# Patient Record
Sex: Female | Born: 1980 | Race: White | Hispanic: No | Marital: Married | Smoking: Former smoker
Health system: Southern US, Community
[De-identification: ages and names within clinical notes are randomized; demographics above are authoritative.]

## PROBLEM LIST (undated history)

## (undated) DIAGNOSIS — N760 Acute vaginitis: Secondary | ICD-10-CM

## (undated) DIAGNOSIS — F329 Major depressive disorder, single episode, unspecified: Secondary | ICD-10-CM

## (undated) DIAGNOSIS — F32A Depression, unspecified: Secondary | ICD-10-CM

## (undated) DIAGNOSIS — K769 Liver disease, unspecified: Secondary | ICD-10-CM

## (undated) DIAGNOSIS — B9689 Other specified bacterial agents as the cause of diseases classified elsewhere: Secondary | ICD-10-CM

## (undated) HISTORY — DX: Other specified bacterial agents as the cause of diseases classified elsewhere: N76.0

## (undated) HISTORY — DX: Major depressive disorder, single episode, unspecified: F32.9

## (undated) HISTORY — DX: Liver disease, unspecified: K76.9

## (undated) HISTORY — PX: REDUCTION MAMMAPLASTY: SUR839

## (undated) HISTORY — DX: Other specified bacterial agents as the cause of diseases classified elsewhere: B96.89

## (undated) HISTORY — DX: Depression, unspecified: F32.A

---

## 2005-03-27 ENCOUNTER — Emergency Department: Payer: Self-pay | Admitting: Emergency Medicine

## 2006-03-16 ENCOUNTER — Other Ambulatory Visit: Payer: Self-pay

## 2006-03-16 ENCOUNTER — Emergency Department: Payer: Self-pay | Admitting: Emergency Medicine

## 2014-01-06 ENCOUNTER — Encounter (HOSPITAL_COMMUNITY): Payer: Self-pay | Admitting: Psychology

## 2014-01-07 ENCOUNTER — Other Ambulatory Visit (HOSPITAL_COMMUNITY): Payer: No Typology Code available for payment source | Attending: Psychiatry | Admitting: Psychology

## 2014-01-07 ENCOUNTER — Encounter (HOSPITAL_COMMUNITY): Payer: Self-pay

## 2014-01-07 DIAGNOSIS — F17210 Nicotine dependence, cigarettes, uncomplicated: Secondary | ICD-10-CM | POA: Diagnosis not present

## 2014-01-07 DIAGNOSIS — Z6372 Alcoholism and drug addiction in family: Secondary | ICD-10-CM

## 2014-01-07 DIAGNOSIS — F1020 Alcohol dependence, uncomplicated: Secondary | ICD-10-CM | POA: Diagnosis not present

## 2014-01-07 DIAGNOSIS — F4310 Post-traumatic stress disorder, unspecified: Secondary | ICD-10-CM

## 2014-01-07 DIAGNOSIS — F1311 Sedative, hypnotic or anxiolytic abuse, in remission: Secondary | ICD-10-CM | POA: Insufficient documentation

## 2014-01-07 DIAGNOSIS — Z599 Problem related to housing and economic circumstances, unspecified: Secondary | ICD-10-CM | POA: Insufficient documentation

## 2014-01-07 DIAGNOSIS — Z811 Family history of alcohol abuse and dependence: Secondary | ICD-10-CM

## 2014-01-07 DIAGNOSIS — F1220 Cannabis dependence, uncomplicated: Secondary | ICD-10-CM | POA: Insufficient documentation

## 2014-01-07 DIAGNOSIS — F1910 Other psychoactive substance abuse, uncomplicated: Secondary | ICD-10-CM
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
## 2014-01-10 ENCOUNTER — Encounter (HOSPITAL_COMMUNITY): Payer: Self-pay | Admitting: Psychology

## 2014-01-10 ENCOUNTER — Other Ambulatory Visit (HOSPITAL_COMMUNITY): Payer: No Typology Code available for payment source | Admitting: Psychology

## 2014-01-10 DIAGNOSIS — F1020 Alcohol dependence, uncomplicated: Secondary | ICD-10-CM | POA: Diagnosis not present

## 2014-01-11 ENCOUNTER — Encounter (HOSPITAL_COMMUNITY): Payer: Self-pay | Admitting: Psychology

## 2014-01-11 NOTE — Progress Notes (Signed)
    Daily Group Progress Note  Program: CD-IOP   Group Time: 1-2:30 pm  Participation Level: Active  Behavioral Response: Appropriate and Sharing  Type of Therapy: Process Group  Topic: After checking in, group members took turns sharing about their weekends and any struggles, challenges, or frustrations they faced.  Each group member also provided information about meetings attended.  One group member admitted that he relapsed over the weekend, and the group helped him process what happened and realize where he could have intervened.  The group then transitioned into psychoeducation exploring different dysfunctional roles that exist in families, and identified which role they feel they played in their families of origin.   Group Time: 2:45- 4pm  Participation Level: Active  Behavioral Response: Appropriate  Type of Therapy: Psycho-education Group  Topic: During the second part of group, the discussion about family roles continued.  The group members reflected on their families of origin, roles they played, and what led them to develop the worldviews and core beliefs that they have.  Drug tests were collected today as well.  Summary: The patient stated that she spoke with someone from her job on Friday after group, and found out that she will not be permitted to return to work until she completes the program.  She was distressed by that and is hoping to find something part-time to get through the next two months of being in the program.  The patient attended two meetings over the weekend, and picked up a starter chip.  She also went to the gym and saw her ex-partner, which did not distress her too much.  The patient talked about her roommate's drinking and the fact that there is alcohol in the refrigerator.  She is hesitant to confront her roommate about the issue.  She also is hesitant to tell her family, who she is close to, about her drinking and the fact that she is in treatment  because she feels that she would be "letting them down".  The patient's sobriety date is 11/5.   Family Program: Family present? No   Name of family member(s):   UDS collected: Yes Results: Not yet back from lab  AA/NA attended?: YesFriday and Saturday  Sponsor?: No, but she is new to the program   Wissam Resor, LCAS

## 2014-01-11 NOTE — Progress Notes (Signed)
    Daily Group Progress Note  Program: CD-IOP   Group Time: 1-2:30 pm  Participation Level: Active  Behavioral Response: Appropriate and Sharing  Type of Therapy: Process Group  Topic: Process:  The first part of group was spent in process. Members shared about issues and concerns they are facing in early recovery. A new group member was present today and she introduced herself to the group and told everyone a little bit about herself. During group today, the medical director, Darlyne Russian, met with new group members, followed up with current members and completed a discharge.   Group Time: 2:45- 4pm  Participation Level: Active  Behavioral Response: Sharing  Type of Therapy: Psycho-education Group  Topic: Psycho-Ed: The second half of group was spent discussing a handout on goals for members of a group like this one. The importance of being honest and as transparent as possible was emphasized. Members offered feedback and experiences in the past and their struggles with being open and vulnerable. Drug test results were returned and 2 members were asked to address the positive results. While one member admitted taking a Xanax he had found, the other member denied any knowledge of her positive opiate result. Near the conclusion of the session, a graduation ceremony was held to honor a member who was successfully graduating. Members shared kind words of hope and well-wishes with the graduating member and it was a powerful ending to this group session and the week.   Summary: The patient was new to the group today. She introduced herself and briefly explained what had brought her here. She admitted a long history of drinking and drugging, but reported that she loved her job and is very stressed out about not being able to return to work until she finishes this program. The patient reported she may have to find a part-time job because she cannot go 2 months without income, "I have bills to  pay". She was tearful at times, but remained open and attentive during the session. She did meet with the program director during the session. With the graduation, the patient wished the graduating member well and encouraged him to follow his path. The patient responded well to this first group session and her sobriety date is 11/5.    Family Program: Family present? No   Name of family member(s):   UDS collected: Yes Results: negative  AA/NA attended?: No , but she is new to the program  Sponsor?: No   Yadir Zentner, LCAS
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
## 2014-01-12 ENCOUNTER — Other Ambulatory Visit (HOSPITAL_COMMUNITY): Payer: No Typology Code available for payment source | Admitting: Psychology

## 2014-01-12 DIAGNOSIS — F1020 Alcohol dependence, uncomplicated: Secondary | ICD-10-CM | POA: Diagnosis not present

## 2014-01-13 ENCOUNTER — Encounter (HOSPITAL_COMMUNITY): Payer: Self-pay | Admitting: Psychology

## 2014-01-13 NOTE — Progress Notes (Signed)
    Daily Group Progress Note  Program: CD-IOP   Group Time: 1-2:30 pm  Participation Level: Active  Behavioral Response: Appropriate and Sharing  Type of Therapy: Psycho-education Group  Topic: Psycho-Ed: the first part of group was spent in a psycho-ed. A visitor from the Chaplaincy Program came to speak. Donnelly Stagerlexis Smith introduced herself and proceeded to talk about the roles of family members in an alcoholic household. She provided large posters with pictures and descriptions of the different roles. Members were invited to identify how these roles had or continue to influence their lives. Group members shared about how these roles might continue to play out. The disclosures invited more feedback and in-depth sharing among the group.   Group Time: 2:45- 4pm  Participation Level: Active  Behavioral Response: Appropriate and Sharing  Type of Therapy: Process Group  Topic: Process/Graduation: the second part of group was spent in process. Members disclosed about issues and concerns in early recovery. During this session, members were provided with the results of the most recent drug tests. Two results had been positive. Those two members were asked about the results and they both admitted having used. They had both denied any use during the previous session. Their previous denials were discussed at length. As the session neared the end, a graduation ceremony was held for a successfully graduating member. Group members shared kind words of hope and encouragement.   Summary: The patient was attentive and engaged actively in the session with the chaplain. She described herself as having embraced two primary roles; the lost child and the mascot. When I pointed out what happened after her feather left, the patient agreed she became the lot child and basically numbed herself to the pain of his sudden departure. In process, the patient displayed good insight into recovery. She explained to another  group member that his use of alcohol was still an effort to "mask your pain'. The patient was kind, but very clear about how his continued use serves to feed his addiction. Despite never having had treatment before, she seemed to understand the addictive process very well. The patient has attended 3 AA meetings since she first began the program. Her sobriety date is 11/5.     Family Program: Family present? No   Name of family member(s):   UDS collected: No Results:   AA/NA attended?: [place]  Sponsor?: No, but she is new and looking for one   Ebenezer Mccaskey, LCAS

## 2014-01-14 ENCOUNTER — Other Ambulatory Visit (HOSPITAL_COMMUNITY): Payer: No Typology Code available for payment source | Admitting: Licensed Clinical Social Worker

## 2014-01-14 DIAGNOSIS — F4310 Post-traumatic stress disorder, unspecified: Secondary | ICD-10-CM

## 2014-01-14 DIAGNOSIS — F1220 Cannabis dependence, uncomplicated: Secondary | ICD-10-CM

## 2014-01-14 DIAGNOSIS — F1020 Alcohol dependence, uncomplicated: Secondary | ICD-10-CM

## 2014-01-17 ENCOUNTER — Other Ambulatory Visit (HOSPITAL_COMMUNITY): Payer: No Typology Code available for payment source | Admitting: Psychology

## 2014-01-17 ENCOUNTER — Encounter (HOSPITAL_COMMUNITY): Payer: Self-pay | Admitting: Licensed Clinical Social Worker

## 2014-01-17 DIAGNOSIS — F1020 Alcohol dependence, uncomplicated: Secondary | ICD-10-CM | POA: Diagnosis not present

## 2014-01-17 DIAGNOSIS — F1220 Cannabis dependence, uncomplicated: Secondary | ICD-10-CM

## 2014-01-17 DIAGNOSIS — F4310 Post-traumatic stress disorder, unspecified: Secondary | ICD-10-CM

## 2014-01-17 NOTE — Progress Notes (Signed)
    Daily Group Progress Note  Program: CD-IOP   Group Time: 1-2:30 pm  Participation Level: Active  Behavioral Response: Appropriate and Sharing  Type of Therapy: Process Group  Topic: Process/Psychoeducation: After checking in, group members spent the first part of group sharing what has been going on since our last group meeting on Wednesday. After processing, the group was asked to consider what they may have learned from the visit from the chaplain on Wednesday. The importance of self-compassion and self-care were discussed at length and each member shared what he/she would agree to focus on for their own self-care.   Group Time: 2:45-4pm  Participation Level: Active  Behavioral Response: Appropriate and Sharing  Type of Therapy: Psycho-education Group  Topic: Psychoeducation/Graduation: The second part of group was spent with group members "sculpting" their families.  Three members reenacted a sculpture of their biological family. They were asked to sculpt a scene from their childhood.  At the end of the group there was a graduation celebration for a group member who was leaving the program having completed successfully. Kind words of hope and self-care were shared by her fellow group members.    Summary: The patient reported that she will be eating better and going to the gym as part of her personal self-care. 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 She reported that she has not been to a meeting but plans to go tonight to a meeting. The patient reenacted her family sculpture and received positive feedback. It was an emotional experience for her and the pain of her father's departure was deeply felt. The patient reported some cravings for alcohol but instead of seeking alcohol, she smoked a cigarette and ate candy. She also reported she doesn't usually smoke cigarettes but noticed she has been smoking  more since she stopped her use of alcohol. The very common substitution of candy for alcohol in early recovery was identified and she recognized as much. The patient admitted she had experienced these cravings after speaking with her ex-partner of 6 years. She pointed out that she was feeling angry and sad and, in the past, she had typically numbed those feelings through alcohol and drugs. The patient responded well to this intervention and displayed good insight into her recovery. Her sobriety date remains 11/5.    Family Program: Family present? No   Name of family member(s):   UDS collected: No Results:    AA/NA attended?: No, but she reported intention to attend meeting tonight  Sponsor?: No   Elyce Zollinger S, Licensed Cli

## 2014-01-19 ENCOUNTER — Other Ambulatory Visit (HOSPITAL_COMMUNITY): Payer: No Typology Code available for payment source | Admitting: Psychology

## 2014-01-19 ENCOUNTER — Encounter (HOSPITAL_COMMUNITY): Payer: Self-pay | Admitting: Psychology

## 2014-01-19 DIAGNOSIS — F4310 Post-traumatic stress disorder, unspecified: Secondary | ICD-10-CM

## 2014-01-19 DIAGNOSIS — F1220 Cannabis dependence, uncomplicated: Secondary | ICD-10-CM

## 2014-01-19 DIAGNOSIS — F1020 Alcohol dependence, uncomplicated: Secondary | ICD-10-CM

## 2014-01-19 NOTE — Progress Notes (Signed)
    Daily Group Progress Note  Program: CD-IOP   Group Time: 1-2:30 pm  Participation Level: Active  Behavioral Response: Appropriate and Sharing  Type of Therapy: Process Group  Topic: Group Process: the first part of group was spent in process. Members shared about the past weekend. One group member admitted he had relapsed on Friday night. His relapse was diagrammed and discussed at length. Members provided good suggestions and ideas about what he might have done differently. A handout was provided titled, "Recovery Activities" and members were invited to identify different things they do to support their recovery. The importance of routine and schedules was emphasized. By and large, members are not fully engaged in their recovery. They will need to re-up or be asked to leave.   Group Time: 2:45- 4pm  Participation Level: Active  Behavioral Response: Appropriate and Sharing  Type of Therapy: Psycho-education Group  Topic: Psycho-ED: the second half of group was spent in a psycho=-ed. Members continued the 'Family Sculpture" from the last session. Members sculpted their families and this event went much more smoothly since they were familiar with this therapeutic process from last Friday. There was also significantly increased and more productive feedback. Members made excellent comments and the sculpting members responded well to their group members' comments. Drug tests were collected from all 8 group members present.   Summary: The patient reported she had had a busy weekend. She had spent most of the weekend with her grandparents. 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 The patient reported she had gone to the gym and went bowling with friends. She had also gone with friends to a bar - "I guess I wanted to test myself", she explained. Another member noted he could never go to a bar and not drink and I encouraged group members "not to test  yourself". The patient served as a family member in some of the sculptures completed today. She made some good comments during the sculpturing session. She remains alcohol-free, but will have to pick up the pace and attend more AA meetings or be discharged from the program. Her sobriety date remains 11/5.    Family Program: Family present? No   Name of family member(s):   UDS collected: Yes Results: negative  AA/NA attended?: No  Sponsor?: No   Kamiryn Bezanson, LCAS

## 2014-01-20 ENCOUNTER — Telehealth (HOSPITAL_COMMUNITY): Payer: Self-pay | Admitting: Psychology

## 2014-01-20 ENCOUNTER — Encounter (HOSPITAL_COMMUNITY): Payer: Self-pay | Admitting: Psychology

## 2014-01-20 NOTE — Progress Notes (Signed)
    Daily Group Progress Note  Program: CD-IOP   Group Time: 1-2:30 pm  Participation Level: Active  Behavioral Response: Appropriate and Sharing  Type of Therapy: Process Group  Topic: Group Process: the first part of group was spent in process. Members shared about the past weekend. One group member admitted he had relapsed on Friday night. His relapse was diagrammed and discussed at length. Members provided good suggestions and ideas about what he might have done differently. A handout was provided titled, "Recovery Activities" and members were invited to identify different things they do to support their recovery. The importance of routine and schedules was emphasized. By and large, members are not fully engaged in their recovery. They will need to re-up or be asked to leave.   Group Time: 2:45- 4pm  Participation Level: Active  Behavioral Response: Sharing  Type of Therapy: Psycho-education Group  Topic: Psycho-ED: the second half of group was spent in a psycho-ed. Members continued the 'Family Sculpture" from the last session. Members sculpted their families and this event went much more smoothly since they were familiar with this therapeutic process from last Friday. There was also significantly increased and more productive feedback. Members made excellent comments and the sculpting members responded well to their group members' comments. Drug tests were collected from all 8 group members present.    Summary: The patient reported she had had a busy weekend. She had spent a lot of time in the hospital looking after her grandparents. 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 She also went to the gym and to the bowling alley with friends. The patient admitted she want to "test myself" and went to the bar with friends. She insisted she had no desire to drink, and had fun. Another member reported he would not be  able to stay sober in a bar and I discouraged group members from "testing themselves". The patient admitted she had not attended any meetings over the weekend and suggested she had not had enough time. The patient served as a family member during the [employer]ure session. She provided good feedback and displayed good insight. The patient was reminded she will be required to attend at least 4 meetings per week. She assured the group that she would do that. A drug test was collected from the patient - her sobriety date remains 11/5.    Family Program: Family present? No   Name of family member(s):   UDS collected: Yes Results: negative  AA/NA attended?: No  Sponsor?: No   Yazmyne Sara, LCAS

## 2014-01-20 NOTE — Progress Notes (Signed)
    Daily Group Progress Note  Program: CD-IOP   Group Time: 1-2:30 pm  Participation Level: Minimal  Behavioral Response: Appropriate  Type of Therapy: Psycho-education Group  Topic: Psycho-Ed: the first part of group was spent in a psycho-ed with the visiting Pharmacist. EP came from upstairs in the [facility] and spoke with the group about drugs, different types of mental illness and the medications prescribed to address them. Her presentation included the effects that drugs of addiction have on the brain and body and the medications used to address these imbalances. There were good questions asked by group members and the session proved very informative.   Group Time: 2:45- 4pm  Participation Level: Active  Behavioral Response: Appropriate and Sharing  Type of Therapy: Process Group  Topic: Group Process: the second half of group was spent in process. Members shared about issues or concerns in early recovery and what they had done since we last met.  The issue and obvious resistance to attending 12-step meetings were discussed during this time. Members that are actively engaged in the Fellowship shared about their initial resistance, but also identified the many benefits that regular attendance provides them. Those finding meetings difficult or unpleasant were invited to identify the problems they are having. The importance of building support for this chronic illness was emphasized and most seemed to accept this tenet. Drug tests from Monday were returned and the results demonstrated that members had remained abstinent over the weekend.   Summary: The patient shared little during the visit with the pharmacist. She is not currently prescribed any medications, but was very familiar with many of the medications discussed. The patient reported she has been staying with her grandfather for the last few nights until her grandmother was discharged from the hospital. She went to the 12:10 AA meeting  at the [employer] today and saw another patient from the CD-IOP group. She enjoyed that meeting and especially enjoyed seeing someone she knew. The patient reported she had experienced no cravings for alcohol. She also went to the gym and is excited about an upcoming visit from her cousin this weekend. The patient reported that she is trying hard this time to stay sober and since nothing else has worked in the past, she is trying something different this time by going to meetings. She asked the group for feedback on how to get a sponsor. Her question brought good discussion about how other members have secured sponsors and the patient received good feedback on what to look for in a sponsor. The patient responded well to this intervention and is making good progress in early recovery. Her sobriety date remains 11/5.     Family Program: Family present? No   Name of family member(s):   UDS collected: No Results:  AA/NA attended?: YesWednesday  Sponsor?: No   Gerritt Galentine, LCAS

## 2014-01-20 NOTE — Progress Notes (Unsigned)
Beverly Terrell is a 33 y.o. female patient. CD-IOP: Individual Therapy Session. Met with the patient this morning for her weekly individual counseling session. We agreed to meet early so she could attend the [place] meeting at 10 am at the [facility]. A number of group members have spoken highly of this meeting. The patient reported she was doing well. She reported she is feeling much better physically than she has in a long time. She had not had any drink or drug for 2 weeks. She admitted, though, that her memory was not good and I provided her a handout on PAWS. The patient shared that her ex-fiancee's 75 yo daughter had stayed with her last night. The ex is fighting with the woman she left this patient for after they had been together 6 years. The patient reported she is very close with this 'daughter' and has reminded her that she always has a placer here in her home. We discussed the angst and psychic pain that was brought on by her father's abandonment of his family when she was 33 yo. The patient noted that when she was hospitalized at age 51 with a BAC of .3, she asked for her father repeatedly. She didn't remember this, but was told by family and staff. We discussed things she could begin to consider to help her integrate this loss into her life and move on. The patient agreed that she sometimes worries her dad might die before she has a chance to say good-bye. She has repeatedly attempted to contact him, but he has repeatedly expressed that he has another life now and is not interested in her. The patient agreed that it is something she must prepare to do to free her and move on. She reportred she is scheduled to meet with the [facility] she has worked with since she was suspended from work and referred to this program. I agreed to phone him and provide him with an update on her engagement in treatment. Our session ended and I walked the patient out to the lobby where she was headed to the [place] meeting. Se is making  good progress and seems open and willing to examine and share her past  And the pain she has tried to numb for years. She responded well to this intervention and her sobriety date remains 11/5. We will continue to follow closely in the days ahead.         Zayneb Baucum, LCAS

## 2014-01-20 NOTE — Progress Notes (Incomplete)
    Daily Group Progress Note  Program: CD-IOP   Group Time: 1-2:30  Participation Level: {CHL AMB BH Group Participation:21022742}  Behavioral Response: {CHL AMB BH Group Behavior:21022743}  Type of Therapy: {CHL AMB BH Type of Therapy:21022741}  Topic: ***     Group Time: ***  Participation Level: {CHL AMB BH Group Participation:21022742}  Behavioral Response: {CHL AMB BH Group Behavior:21022743}  Type of Therapy: {CHL AMB BH Type of Therapy:21022741}  Topic: ***   Summary: ***   Family Program: Family present? {BHH YES OR NO:22294}   Name of family member(s): ***  UDS collected: {BHH YES OR NO:22294} Results: {Findings; urine drug screen:60936}  AA/NA attended?: {BHH YES OR NO:22294}{DAYS OF UJWJ:19147}WEEK:22385}  Sponsor?: {BHH YES OR NO:22294}   Imogean Ciampa, LCAS

## 2014-01-20 NOTE — Progress Notes (Unsigned)
    Daily Group Progress Note  Program: IOP   Group Time: 1-2:30 pm  Participation Level: Minimal  Behavioral Response: Sharing  Type of Therapy: Psycho-education Group  Topic: Psycho-Ed: the first part of group was spent in a psycho-ed with the visiting Pharmacist. EP came from upstairs in the [facility] and spoke with the group about drugs, different types of mental illness and the medications prescribed to address them. Her presentation included the effects that drugs of addiction have on the brain and body and the medications used to address these imbalances. There were good questions asked by group members and the session proved very informative.   Group Time: 2:45- 4pm  Participation Level: Active  Behavioral Response: Appropriate and Sharing  Type of Therapy: Process Group  Topic: Group Process: the second half of group was spent in process. Members shared about issues or concerns in early recovery and what they had done since we last met.  The issue and obvious resistance to attending 12-step meetings were discussed during this time. Members that are actively engaged in the Fellowship shared about their initial resistance, but also identified the many benefits that regular attendance provides them. Those finding meetings difficult or unpleasant were invited to identify the problems they are having. The importance of building support for this chronic illness was emphasized and most seemed to accept this tenet. Drug tests from Monday were returned and the results demonstrated that members had remained abstinent over the weekend.  Summary: The patient was attentive and made some good comments during the visit with the pharmacist. 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 The patient reported she had gone to the gym and an [place] meeting. She reported she was  feeling pretty good about herself, but realizes she will have to make a lot of changes and stay with them, even more so when she is done with this program. The patient provided good feedback to her fellow group members. She responded well to this intervention and her sobriety date remains 11/5.    Family Program: Family present? No   Name of family member(s):   UDS collected: No Results:   AA/NA attended?: [place]  Sponsor?: not yet but she has been instructed to secure a sponsor soon   Srah Ake, LCAS

## 2014-01-21 ENCOUNTER — Encounter (HOSPITAL_COMMUNITY): Payer: Self-pay | Admitting: Medical

## 2014-01-21 ENCOUNTER — Other Ambulatory Visit (HOSPITAL_COMMUNITY): Payer: No Typology Code available for payment source | Admitting: Psychology

## 2014-01-21 DIAGNOSIS — F4310 Post-traumatic stress disorder, unspecified: Secondary | ICD-10-CM

## 2014-01-21 DIAGNOSIS — F1020 Alcohol dependence, uncomplicated: Secondary | ICD-10-CM | POA: Diagnosis not present

## 2014-01-21 DIAGNOSIS — F1910 Other psychoactive substance abuse, uncomplicated: Secondary | ICD-10-CM

## 2014-01-21 DIAGNOSIS — F1021 Alcohol dependence, in remission: Secondary | ICD-10-CM

## 2014-01-21 NOTE — Progress Notes (Signed)
  [facility] [id number] Progress Note  Beverly Terrell [id number] 33 y.o.  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  History of Present Illness: Suicidal Ideation: Negative Plan Formed: Negative Patient has means to carry out plan: Negative  Homicidal Ideation: Negative Plan Formed: NA Patient has means to carry out plan: NA  Review of Systems: Psychiatric: Agitation: Negative Hallucination: Negative Depressed Mood: Negative Insomnia: Negative Hypersomnia: Negative Altered Concentration: Negative Feels Worthless: Negative Grandiose Ideas: Negative Belief In Special Powers: Negative New/Increased Substance Abuse: Negative Compulsions: Negative  Neurologic: Headache: Negative Seizure: Negative Paresthesias: Negative  Past Medical Family, Social History: See CDIOP Assessment  Outpatient Encounter Prescriptions as of 01/21/2014  Medication Sig  . azithromycin (ZITHROMAX) 250 MG tablet   . Multiple Vitamin (MULTI-VITAMINS) TABS Take by mouth.    Past Psychiatric History/Hospitalization(s): Anxiety: Negative Bipolar Disorder: Negative Depression: Initial depression has left and she is living one day at a time Mania: Negative Psychosis: Negative Schizophrenia: Negative Personality Disorder: Negative Hospitalization for psychiatric illness: Negative History of Electroconvulsive Shock Therapy: Negative Prior Suicide Attempts: Episode of OD/? og intention per pt-see CD IOP Assessment  Physical Exam: Constitutional:  There were no vitals taken for this visit.  General Appearance: alert, oriented, no acute distress and well nourished  Musculoskeletal: Strength & Muscle Tone: within normal limits Gait & Station: normal Patient leans: N/A  Psychiatric: Speech (describe rate, volume, coherence, spontaneity, and  abnormalities if any): Normal rate/volume;comprehensible  Thought Process (describe rate, content, abstract reasoning, and computation): Logical /coherent/functions intact  Associations: Intact  Thoughts: NO  delusions, hallucinations, homicidal ideation, obsessions and suicidal ideation  Mental Status: Orientation: oriented to person, place, time/date, situation and day of week Mood & Affect: normal affect Attention Span & Concentration: Normal  Medical Decision Making (Choose Three): Established Problem, Stable/Improving (1), Review or order clinical lab tests (1) and Discuss test with performing physician (1)  Assessment: Alcohol dependence with physiologic dependence in remission                         PTSD                         Polysubstance abuse                             Plan: CMP/Liver panel showed elevated SGOT consistent with pt alcohol dependence/ PT informed thatthe elevation was small and Hepatitis panel was negative indicating good prognosis with abstinence  Court JoyKOBER, CHARLES E, PA-C 01/21/2014

## 2014-01-23 ENCOUNTER — Encounter (HOSPITAL_COMMUNITY): Payer: Self-pay | Admitting: Psychology

## 2014-01-23 NOTE — Addendum Note (Signed)
Addended byLogan Bores: [name] on: 01/23/2014 11:04 AM   Modules accepted: Medications

## 2014-01-23 NOTE — Progress Notes (Signed)
    Daily Group Progress Note  Program: CD-IOP   Group Time: 1-2:30 pm  Participation Level: Active  Behavioral Response: Appropriate and Sharing  Type of Therapy: Process Group  Topic: Group Process: The first part of group was spent in process. Members shared about issues and concerns they are facing in early recovery. During this session, two members completed their Family Sculpture. Group members were surprised at how much they learned as both members shared extensively about their home life and childhoods. The sculptures proved very revealing and group members agreed that they had gained insight into their fellow group member's lives and felt as if they knew they much better as a result. During group today the medical director met with at least 2 group members to discuss meds and lab results.   Group Time: 2:45- 4 pm  Participation Level: Active  Behavioral Response: Sharing  Type of Therapy: Psycho-education Group  Topic: Pro's & Con's: the second half of group was spent in a psycho-ed. Members were asked to identify the pros and cons of active addiction and sobriety. The session proved very challenging and at least member disclosed that he had realized a number of things about his drinking that he had never thought about before. Everyone shared freely and the topic seemed likely to continue to invite members to think more deeply about their relationships with chemicals.   Summary: The patient reported she had attended the women's meeting yesterday at the [facility]. It had been a really good one. She felt good about where she was in her recovery when more than one woman admitted struggling with her Higher Power. This patient she wasn't sure about how she was to address this HP. She had also collected phone numbers and was encouraged to use them. She had met with her counselor in the [facility] program and he agreed to contact her supervisor to explore possibly returning to work  part-time while still in this program. In the psycho-ed, the patient identified a pro of drinking as feeling more confident and outgoing while using. A con was doing things that were obnoxious or proved embarrassing later. A pro of sobriety was being able to keep her job and pursue her career while a con was that "it feels as if something is missing". The patient met with the medical director briefly to discuss her lab results and elevated liver enzymes. The patient made some excellent comments and responded well to this intervention. Her sobriety date remains 11/5.   Family Program: Family present? No   Name of family member(s):   UDS collected: No Results:   AA/NA attended?: YesThursday  Sponsor?: No   Solace Manwarren, LCAS

## 2014-01-24 ENCOUNTER — Other Ambulatory Visit (HOSPITAL_COMMUNITY): Payer: No Typology Code available for payment source | Admitting: Psychology

## 2014-01-24 DIAGNOSIS — F4310 Post-traumatic stress disorder, unspecified: Secondary | ICD-10-CM

## 2014-01-24 DIAGNOSIS — F1021 Alcohol dependence, in remission: Secondary | ICD-10-CM

## 2014-01-24 DIAGNOSIS — F1020 Alcohol dependence, uncomplicated: Secondary | ICD-10-CM | POA: Diagnosis not present

## 2014-01-24 DIAGNOSIS — F1220 Cannabis dependence, uncomplicated: Secondary | ICD-10-CM

## 2014-01-25 ENCOUNTER — Encounter (HOSPITAL_COMMUNITY): Payer: Self-pay | Admitting: Psychology

## 2014-01-25 NOTE — Progress Notes (Signed)
    Daily Group Progress Note  Program: CD-IOP   Group Time: 1-2:30 pm  Participation Level: Active  Behavioral Response: Appropriate and Sharing  Type of Therapy: Process Group  Topic: Process/Psycho-ed: the first part of group was spent in process. Members shared about current issues and challenges in early recovery. Included in these disclosures were 12-step meetings attended and any other sort of activity that promotes their sobriety. After check-in, a DVD on "The Neurobiology of Addiction" was shown. The focus of this film was the multitude of chemical reactions that occur totally unbeknownst to the addict that contribute to cravings, relapse and the difficulty of maintaining sobriety in recovery. The medical director met with a new group member and followed up with another during group today. Drug tests were collected from all group members today.   Group Time: 2:45- 4pm  Participation Level: Active  Behavioral Response: Sharing  Type of Therapy: Psycho-education Group  Topic: Intro/Psycho-Ed: The second half of group began with a new group member introducing himself. He had met with the medical director during the first part of group. Upon returning, he shared about his life and what he hopes to get from the group. He was welcomed by his new fellow group members. The remainder of the session was spent discussing a handout titled, "List of Coping Thoughts". The thoughts identified on this list are very beneficial for people struggling with major challenges in their lives. The underlying message to be taken from the handout was that things would not always be like they currently are, but that everything changes. The handout elicited a good discussion and feedback among group members.   Summary: The patient reported she had spent a lot of the weekend with family and relatives that were in town visiting. She had enjoyed the time together. She had attended a meeting on Sunday and it was  okay. The patient reported her ex is staying at her house for the time being. Another group member asked her if that was a threat to her sobriety? The patient agreed that it was kind of hard, but noted that this woman is going to move to another place over the holiday weekend so she won't be in her face so blatantly. The patient reported she planned to remain here in [place] after this group session and attend the Women's Only AA meeting at 6 pm. She enjoyed the DVD and with her nursing background, appeared very comfortable with the information provided. In the second half of group the patient agreed that addressing her negative self-talk and being more affirming of herself was important. The very example of a coping thought that she provided, "You're better than this", was pointed out by another member to actually be negating. She laughed and admitted this was true. The patient made some good comments and responded well to this intervention. Her sobriety date remains 11/5.    Family Program: Family present? No   Name of family member(s):   UDS collected: Yes Results: not back from lab  AA/NA attended?: [place]  Sponsor?: No   Hibo Blasdell, LCAS

## 2014-01-26 ENCOUNTER — Other Ambulatory Visit (HOSPITAL_COMMUNITY): Payer: No Typology Code available for payment source | Admitting: Licensed Clinical Social Worker

## 2014-01-26 DIAGNOSIS — F1021 Alcohol dependence, in remission: Secondary | ICD-10-CM

## 2014-01-26 DIAGNOSIS — F1020 Alcohol dependence, uncomplicated: Secondary | ICD-10-CM | POA: Diagnosis not present

## 2014-01-28 ENCOUNTER — Other Ambulatory Visit (HOSPITAL_COMMUNITY): Payer: No Typology Code available for payment source

## 2014-01-31 ENCOUNTER — Other Ambulatory Visit (HOSPITAL_COMMUNITY): Payer: No Typology Code available for payment source | Admitting: Psychology

## 2014-01-31 DIAGNOSIS — F1020 Alcohol dependence, uncomplicated: Secondary | ICD-10-CM | POA: Diagnosis not present

## 2014-01-31 DIAGNOSIS — F1021 Alcohol dependence, in remission: Secondary | ICD-10-CM

## 2014-01-31 DIAGNOSIS — F4310 Post-traumatic stress disorder, unspecified: Secondary | ICD-10-CM

## 2014-01-31 DIAGNOSIS — F1220 Cannabis dependence, uncomplicated: Secondary | ICD-10-CM

## 2014-02-01 ENCOUNTER — Encounter (HOSPITAL_COMMUNITY): Payer: Self-pay | Admitting: Psychology

## 2014-02-01 ENCOUNTER — Encounter (HOSPITAL_COMMUNITY): Payer: Self-pay | Admitting: Licensed Clinical Social Worker

## 2014-02-01 NOTE — Progress Notes (Signed)
    Daily Group Progress Note  Program: CD-IOP   Group Time: 1-2:30pm  Participation Level: Active  Behavioral Response: Appropriate  Type of Therapy: Psycho-education Group  Topic: after checking in, each group member shared what had happened and been going on for them since the last group meeting. The psychoeducation portion of the group focused on holidays and recovery; each group member assessed their relapse risk for holidays.  Group Time: 2:45-4:00pm  Participation Level: Active  Behavioral Response: Appropriate and Sharing  Type of Therapy: Process Group  Topic: The second part of group was spent having a holiday meal together. One of the group members who is a Investment banker, operationalchef fixed a holiday meal and other group members brought in additional side dishes. The group also discussed gratitude and was given a homework assignment to complete an exercise on gratitude. After the meal, an activity was played within the group which assisted in getting to know more about each group member. Drug tests were returned.  Summary: The patient reported that she went to a new women's meeting today where someone was celebrating 3 years of sobriety. She enjoyed the meeting and will return to the meeting again. She reported that she finally told her mother about her suspension from work and her issues with alcohol. She was tearful in reporting this to the group. She hated to tell her mother this information over the phone but thought her mother might have heard that she was on suspension and needed to be honest with her. She feels like she let her mother down and one group member suggested to her that her mother should attend al-a-non meetings for support. The patient reported New Years Eve would be a relapse risk for her as she always drinks on that holiday. The group suggested she go to more meetings around the holiday to meet more people in recovery who will want to celebrate sober. The patient received her  drug test which was negative. The patient participated in the fun activity and said she enjoyed it. Her sobriety date remains 11/5.   Family Program: Family present? No   Name of family member(s):   UDS collected: Yes Results: Results not returned  AA/NA attended?: YesWednesday  Sponsor?: No   MACKENZIE,LISBETH S, Licensed Cli

## 2014-02-01 NOTE — Progress Notes (Addendum)
    Daily Group Progress Note  Program: CD-IOP   Group Time: 1-2:30 pm  Participation Level: Active  Behavioral Response: Appropriate and Sharing  Type of Therapy: Process Group  Topic: Process/Psychoeducation:  After checking in, the group members each shared about their Thanksgiving and weekend.  After processing what had happened for each of them over the long weekend, the homework assignment was reviewed and group members shared what they were grateful for and how completing the assignment was for them.  The group then began discussing communication strategies, specifically refusal skills and how to handle situations when drugs or alcohol are offered to them.  Group Time: 2:45- 4pm  Participation Level: Active  Behavioral Response: Sharing  Type of Therapy: Psycho-education Group  Topic: Psychoeducation:  The second portion of the group was spent continuing the discussion about communication strategies and refusal skills.  Group members shared their experiences of being offered drugs or alcohol and how they handled the situations.  The difficulty of having general conversations with others when sober was also discussed.  Drug tests were collected during group today.  Summary: The patient stated that she had a rough weekend and had some cravings over the holiday.  She reported that her ex-fiance had moved into her own place over the holiday, and that they got into a fight on Friday.  She went out to a bar on Saturday night with a friend because she did not want to be alone with her feelings, but did not relapse.  Group members shared their concern with her decision to go out to bars and put herself in such a risky situation.  The patient expressed that going out to bars is the only way she knows how to socialize and that she was tempted to "use sex like drugs" to change her feelings.  She reported that her stress level was high today.  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  She attended two meetings and spoke at one.  While discussing the homework assignment, she stated that she was thankful for second chances and having a clearing mind.  The patient's sobriety date is 11/5.  Family Program: Family present? No   Name of family member(s):   UDS collected: Yes Results: not returned yet  AA/NA attended?: YesSaturday and Sunday  Sponsor?: No   Jasara Corrigan, LCAS

## 2014-02-02 ENCOUNTER — Other Ambulatory Visit (HOSPITAL_COMMUNITY): Payer: No Typology Code available for payment source | Attending: Psychiatry | Admitting: Psychology

## 2014-02-02 DIAGNOSIS — F1910 Other psychoactive substance abuse, uncomplicated: Secondary | ICD-10-CM | POA: Diagnosis not present

## 2014-02-02 DIAGNOSIS — F17210 Nicotine dependence, cigarettes, uncomplicated: Secondary | ICD-10-CM | POA: Insufficient documentation

## 2014-02-02 DIAGNOSIS — F1020 Alcohol dependence, uncomplicated: Secondary | ICD-10-CM | POA: Diagnosis present

## 2014-02-02 DIAGNOSIS — F4310 Post-traumatic stress disorder, unspecified: Secondary | ICD-10-CM | POA: Insufficient documentation

## 2014-02-02 DIAGNOSIS — Z639 Problem related to primary support group, unspecified: Secondary | ICD-10-CM | POA: Diagnosis not present

## 2014-02-02 DIAGNOSIS — Z599 Problem related to housing and economic circumstances, unspecified: Secondary | ICD-10-CM | POA: Insufficient documentation

## 2014-02-04 ENCOUNTER — Other Ambulatory Visit (HOSPITAL_COMMUNITY): Payer: No Typology Code available for payment source | Admitting: Psychology

## 2014-02-04 DIAGNOSIS — F1020 Alcohol dependence, uncomplicated: Secondary | ICD-10-CM | POA: Diagnosis not present

## 2014-02-04 DIAGNOSIS — F1021 Alcohol dependence, in remission: Secondary | ICD-10-CM

## 2014-02-04 DIAGNOSIS — F4310 Post-traumatic stress disorder, unspecified: Secondary | ICD-10-CM

## 2014-02-04 DIAGNOSIS — F1220 Cannabis dependence, uncomplicated: Secondary | ICD-10-CM

## 2014-02-06 ENCOUNTER — Encounter (HOSPITAL_COMMUNITY): Payer: Self-pay | Admitting: Psychology

## 2014-02-06 NOTE — Progress Notes (Signed)
    Daily Group Progress Note  Program: CD-IOP   Group Time: 1-2:30 pm  Participation Level: Active  Behavioral Response: Appropriate and Sharing  Type of Therapy: Process Group  Topic: Process: the first part of group was spent in process. Members shared about issues and concerns that had arisen since the last group. They were also asked to disclose what they had done to support their sobriety. A new group member was present and she introduced herself to her new group members. She received a very warm and validating welcome from the group and expressed her gratitude for their support.  Group Time: 2:45- 4pm  Participation Level: Active  Behavioral Response: Sharing  Type of Therapy: Psycho-education Group  Topic: "Wheel of Life": the second part of group was spent in a psycho-ed. Members were provided with a handout asking them to identify their satisfaction with where they currently are relative to eight major categories of life.  After drawing these on their handout, members came forth individually and drew their 'wheels' on the board. Each group member followed with an explanation for where they are relative to each of the eight categories. The session proved revealing with good disclosure and feedback among group members.    Summary: The patient reported she had attended an AA meeting here in [place] at 6 pm Monday evening. It was a women's meeting and she found it very validating and interesting. She has found more women she can relate to in the AA meetings in [place] as opposed to [place] which is where she lives. The patient admitted she didn't do anything yesterday. When questioned further, she admitted she hadn't gone to the gym in over a week. I pointed out this is a 'red flag' and it may indicate she is moving towards relapse. The importance of routine and schedule in early recovery was reiterated. The patient reported she had told her ex that they could not be  friends right now, she has to get over the breakup and heartache that came with her 32 yo relationship ending. She received good feedback from her fellow group members who expressed concerns about the 'ex' and how she may contribute to problems in early recovery. The patient received the support gratefully. In part two, the patient drew her wheel. She reported that she is very content and satisfied with her career (although her addiction has her out of work right now) and her family relationships. She has a lot of friends and loves her home. She admitted she must address her mental health and working to have fun while she is out of work. The patient is doing well in her recovery and meeting all of the requirements of the program. She has been encouraged to secure a temporary sponsor. Her sobriety remains 11/5.    Family Program: Family present? No   Name of family member(s):   UDS collected: No Results:  AA/NA attended?: [place]  Sponsor?: No, but she has been encouraged to secure a temporary sponsor   Markes Shatswell, LCAS

## 2014-02-07 ENCOUNTER — Encounter (HOSPITAL_COMMUNITY): Payer: Self-pay | Admitting: Psychology

## 2014-02-07 ENCOUNTER — Other Ambulatory Visit (HOSPITAL_COMMUNITY): Payer: No Typology Code available for payment source | Admitting: Psychology

## 2014-02-07 DIAGNOSIS — F1220 Cannabis dependence, uncomplicated: Secondary | ICD-10-CM

## 2014-02-07 DIAGNOSIS — F1020 Alcohol dependence, uncomplicated: Secondary | ICD-10-CM | POA: Diagnosis not present

## 2014-02-07 DIAGNOSIS — F1021 Alcohol dependence, in remission: Secondary | ICD-10-CM

## 2014-02-07 DIAGNOSIS — F4310 Post-traumatic stress disorder, unspecified: Secondary | ICD-10-CM

## 2014-02-07 NOTE — Progress Notes (Signed)
    Daily Group Progress Note  Program: CD-IOP   Group Time: 1-2:30 pm  Participation Level: Active  Behavioral Response: Appropriate and Sharing  Type of Therapy: Process Group  Topic: Process: the first part of group was spent in process. Members shared about any problems or challenges in early recovery. They also disclosed what they had done since the last group to support their sobriety. One member described his frustration at getting too far ahead of himself. He shared that instead of worrying about what is going to happen next week, or next month, or when he graduates, he is most at peace when he is staying in the present moment. Other members agreed with this observation. The importance of "Focusing on Now" or the present moment was emphasized. During group today, the medical director, CK, met with the new member and 4 other group members to discuss meds, drug tests results or review discharge plans.   Group Time: 2:45- 4pm  Participation Level: Active  Behavioral Response: Sharing  Type of Therapy: Psycho-education Group  Topic: "The Five Love Languages": the second half of group was spent in a psycho-ed on the [employer]. Members were provided a handout that required them to pick the best answer out of 30 questions. The 5 different categories, or love languages, were all included in the 1usetions. By adding up their answers, patients were able to identify where they scored highest among these 5 different categories. The results were recorded on the board and members explained their answers with details and examples. The importance of knowing one's own love language or how one most feels loved and cared for was discussed. Likewise, the importance of knowing the primary love language of one's spouse or S/O is also important.   Summary: The patient reported she had attended an Cable meeting in [place] and gone to the gym since the last group session. She admitted she had  fallen off her routine and was working to get herself back on it. In response to another member's comments, the patient reported she had experienced a lot of guilt and shame when she first arrived. She reported that by being in group and looking at things differently, "I'm not beating myself any more". The patient was supportive and validating to the new group member. In the psycho-ed, the patient Identified 'quality time' as the most important of her love languages, but stated that 'words of affirmation' are also very important to her. 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 The patient continues to make good progress in her recovery and she responded well to this intervention. Her sobriety date remains 11/5.   Family Program: Family present? No   Name of family member(s):   UDS collected: No Results:  AA/NA attended?: YesThursday  Sponsor?: No, but she has been encouraged to secure a temporary one.   Victorhugo Preis, LCAS

## 2014-02-08 ENCOUNTER — Telehealth (HOSPITAL_COMMUNITY): Payer: Self-pay | Admitting: Psychology

## 2014-02-08 ENCOUNTER — Encounter (HOSPITAL_COMMUNITY): Payer: Self-pay | Admitting: Psychology

## 2014-02-08 NOTE — Progress Notes (Signed)
    Daily Group Progress Note  Program: CD-IOP   Group Time: 1-2:30 pm  Participation Level: Active  Behavioral Response: Appropriate and Sharing  Type of Therapy: Process Group  Topic: Process/Psychoeducation:  After checking in, group members took turns sharing about their weekends, any challenges they encountered, and any recovery related activities that they did.  Group members gave each other feedback and asked each other questions during the process portion of group.  The group then moved into a discussion about external and internal triggers.  Each group member identified their individual triggers and discussed how to appropriately handle them.  Drug tests were returned to some members and collected from others.   Group Time: 2:45- 4pm  Participation Level: Active  Behavioral Response: Appropriate and Sharing  Type of Therapy: Psycho-education Group  Topic: Psychoeducation:  The second portion of group focused on values.  Group members completed an activity in which they identified the importance of certain values and their success with living their values over the past month.  They also ranked the values in order of the importance of working on them right now.  Group members shared their results with each other.  As a homework assignment, group members will identify their top three values, as well as three specific ways they can live those values.  Summary: The patient stated that she attended a Christmas parade over the weekend with her sister, niece, and nephew, went to the gym, and attended a meeting.  She also attended the basketball game of her ex-fianc's daughter.  She and her ex had a "good conversation" over the weekend as well.  The patient reported that she attended church on Sunday, and has become much more open to exploring her spirituality.  She told the group that her engagement ring sold and the money she got for the ring will pay her bills for the month of  January.  She felt that God helped her in that situation.  She also identified sadness as an internal trigger.  The patient was open in group and is doing well.  Her sobriety date remains 11/5.   Family Program: Family present? No   Name of family member(s):   UDS collected: No Results:  AA/NA attended?: Yes, Saturday  Sponsor?: No   Dewana Ammirati, LCAS

## 2014-02-08 NOTE — Progress Notes (Signed)
Beverly Terrell is a 33 y.o. female patient. CD-IOP: Treatment Plan Update. I met with the patient at the conclusion of group today. The session this afternoon represented her 12th group session and I explained the need to review her goals of treatment. Her first goal was ongoing sobriety, which she has been successful in attaining. The patient began the program with a sobriety date of 11/5 and she has kept that same date. Regarding building support for her recovery, she has attended at least 4 12-step meetings as required in this program, informed her family of her treatment and has a number of friends who will support her efforts to remain alcohol and drug-free. Her third treatment goal was to return to work. I pointed out she is halfway there since her employer is requiring her to complete this program successfully before she is allowed to return to work. 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 The patient admitted she didn't understand this since she was at work.  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 Apparently, the event will go in her file, but no other actions will be taken at this time. The patient signed the updated treatment plan and reported she was going to the 6 pm Women's AA meeting. She has attended it at least 3 times and enjoys the people in the meeting and is looking for a temporary sponsor. She continues to make excellent progress and remains compliant in all aspects of the program. The patient's sobriety date remains 11/5.        [facility]

## 2014-02-09 ENCOUNTER — Other Ambulatory Visit (HOSPITAL_COMMUNITY): Payer: No Typology Code available for payment source | Admitting: Psychology

## 2014-02-09 DIAGNOSIS — F1220 Cannabis dependence, uncomplicated: Secondary | ICD-10-CM

## 2014-02-09 DIAGNOSIS — F1021 Alcohol dependence, in remission: Secondary | ICD-10-CM

## 2014-02-09 DIAGNOSIS — F4310 Post-traumatic stress disorder, unspecified: Secondary | ICD-10-CM

## 2014-02-09 DIAGNOSIS — F1020 Alcohol dependence, uncomplicated: Secondary | ICD-10-CM | POA: Diagnosis not present

## 2014-02-10 ENCOUNTER — Encounter (HOSPITAL_COMMUNITY): Payer: Self-pay | Admitting: Psychology

## 2014-02-10 NOTE — Progress Notes (Signed)
    Daily Group Progress Note  Program: CD-IOP   Group Time: 1-2:30 pm  Participation Level: Active  Behavioral Response: Appropriate and Sharing  Type of Therapy: Psycho-education Group  Topic: Psycho-Ed: Forgiveness. The first part of group was spent with a visitor from the chaplaincy program. Theda BelfastBob Hamilton came to speak with the group about 'forgiveness'. This topic developed out of the topic on "Values", that the group has been discussing for the last two sessions. He provided handouts and the session included group members sharing about their own forgiveness issues and reading from the handouts. Almost every member admitted that forgiving others is not difficult, but having forgiveness for themselves is the part they struggle with.   Group Time: 2:45- 4pm  Participation Level: Active  Behavioral Response: Appropriate and Sharing  Type of Therapy: Psycho-education Group  Topic: Values/Gradation: the second half of group was spent in a presentation on Values. Members had been given handouts at the conclusion of the last session and the homework assignment was discussed during this session. Members shared the thing that the valued and then followed with 3 specific ways they can live this value. The values varied among members and there was good feedback and discussion among the group. This psycho-ed concluded with 20 minutes remaining. A graduation ceremony was held with brownies and the medallion and kind words of hope and strength shared among members.   Summary: The patient was attentive and shared openly in the session with the chaplain. She read from the handout and identified that quote about: "giving up my right to hurt you for hurting me". She agreed with another member that she did struggle with forgiving herself, but not others. The patient reported that in her early recovery, she was focusing on her spiritual self. She had ignored it for years and now realizes it is a very  important part of her recovery and finding peace. In the second half of group, the patient reported that her value was her job. The 3 things she can do to reflect this value in her daily life includes: staying sober, taking better care of her physical self, and being a better leader and supervisor at work. The patient continues to make good progress in her recovery and is meeting all requirements of the CD-IOP. Her sobriety date is 11/5.     Family Program: Family present? No   Name of family member(s):   UDS collected: No Results:   AA/NA attended?: [place]  Sponsor?: No, but she is seeking a sponsor   Keyaan Lederman, LCAS

## 2014-02-11 ENCOUNTER — Other Ambulatory Visit (HOSPITAL_COMMUNITY): Payer: No Typology Code available for payment source

## 2014-02-14 ENCOUNTER — Other Ambulatory Visit (HOSPITAL_COMMUNITY): Payer: No Typology Code available for payment source | Admitting: Psychology

## 2014-02-14 DIAGNOSIS — F1220 Cannabis dependence, uncomplicated: Secondary | ICD-10-CM

## 2014-02-14 DIAGNOSIS — F1020 Alcohol dependence, uncomplicated: Secondary | ICD-10-CM | POA: Diagnosis not present

## 2014-02-14 DIAGNOSIS — F1021 Alcohol dependence, in remission: Secondary | ICD-10-CM

## 2014-02-14 DIAGNOSIS — Z811 Family history of alcohol abuse and dependence: Secondary | ICD-10-CM

## 2014-02-14 DIAGNOSIS — Z6372 Alcoholism and drug addiction in family: Secondary | ICD-10-CM

## 2014-02-15 ENCOUNTER — Encounter (HOSPITAL_COMMUNITY): Payer: Self-pay | Admitting: Psychology
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
## 2014-02-16 ENCOUNTER — Other Ambulatory Visit (HOSPITAL_COMMUNITY): Payer: No Typology Code available for payment source | Admitting: Psychology

## 2014-02-16 DIAGNOSIS — F1020 Alcohol dependence, uncomplicated: Secondary | ICD-10-CM | POA: Diagnosis not present

## 2014-02-18 ENCOUNTER — Other Ambulatory Visit (HOSPITAL_COMMUNITY): Payer: No Typology Code available for payment source | Admitting: Psychology

## 2014-02-18 DIAGNOSIS — F1020 Alcohol dependence, uncomplicated: Secondary | ICD-10-CM | POA: Diagnosis not present

## 2014-02-18 DIAGNOSIS — Z6372 Alcoholism and drug addiction in family: Secondary | ICD-10-CM

## 2014-02-18 DIAGNOSIS — Z811 Family history of alcohol abuse and dependence: Secondary | ICD-10-CM

## 2014-02-18 DIAGNOSIS — F1220 Cannabis dependence, uncomplicated: Secondary | ICD-10-CM

## 2014-02-20 ENCOUNTER — Encounter (HOSPITAL_COMMUNITY): Payer: Self-pay | Admitting: Psychology
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
## 2014-02-21 ENCOUNTER — Other Ambulatory Visit (HOSPITAL_COMMUNITY): Payer: No Typology Code available for payment source | Admitting: Psychology

## 2014-02-21 DIAGNOSIS — F1220 Cannabis dependence, uncomplicated: Secondary | ICD-10-CM

## 2014-02-21 DIAGNOSIS — F1020 Alcohol dependence, uncomplicated: Secondary | ICD-10-CM | POA: Diagnosis not present

## 2014-02-21 DIAGNOSIS — F4310 Post-traumatic stress disorder, unspecified: Secondary | ICD-10-CM

## 2014-02-21 DIAGNOSIS — Z6372 Alcoholism and drug addiction in family: Secondary | ICD-10-CM

## 2014-02-21 DIAGNOSIS — Z811 Family history of alcohol abuse and dependence: Secondary | ICD-10-CM

## 2014-02-23 ENCOUNTER — Other Ambulatory Visit (HOSPITAL_COMMUNITY): Payer: No Typology Code available for payment source | Admitting: Psychology

## 2014-02-23 ENCOUNTER — Encounter (HOSPITAL_COMMUNITY): Payer: Self-pay | Admitting: Psychology

## 2014-02-23 DIAGNOSIS — F1020 Alcohol dependence, uncomplicated: Secondary | ICD-10-CM | POA: Diagnosis not present

## 2014-02-23 DIAGNOSIS — F1220 Cannabis dependence, uncomplicated: Secondary | ICD-10-CM

## 2014-02-23 DIAGNOSIS — Z811 Family history of alcohol abuse and dependence: Secondary | ICD-10-CM

## 2014-02-23 DIAGNOSIS — Z6372 Alcoholism and drug addiction in family: Secondary | ICD-10-CM

## 2014-02-23 NOTE — Progress Notes (Signed)
    Daily Group Progress Note  Program: CD-IOP   Group Time: 1-2:30 pm  Participation Level: Active  Behavioral Response: Appropriate and Sharing  Type of Therapy: Process Group  Topic: Process:  The first part of group was spent in process. Members shared about current issues and concerns in early recovery.  A new member was present and he introduced himself and shared about his reasons for being here in the program. Drug tests were collected from some members and the results of those collected on Friday returned. During this session, new patients met with the medical director in addition to current members who needed to discuss progress. A member brought a wonderful dinner of barbecue and coleslaw that he had prepared along with broccoli and cheese casserole another member had brought. The group shared about Christmas memories and rituals while we enjoyed this sumptuous meal.   Group Time: 2:45- 4pm  Participation Level: Active  Behavioral Response: Sharing  Type of Therapy: Psycho-education Group  Topic: Relationships:  Health versus Unhealthy Relationships and what they look like. The second half of group was spent in a psycho-ed on relationships. A handout was provided detailing specific examples of ways that relationships are unhealthy. Members took turns reading those characteristics and discussing them at length. Many provided examples of how they had engaged in these dysfunctional behaviors and identified what they hoped to get from these behaviors. Every member could relate to one of those listed on the handouts. The group also took turns reading the characteristics of a health relationship. No one disavowed the truth of those listed, but many agreed that it was hard to behave in these ways for fear of the partner's response. The session proved very engaging with everyone sharing something about relationships, past or present.   Summary: The patient reported she had attended 2 AA  meetings over the weekend. She went to the gym and did some shopping. The patient admitted she had some difficult and frustrating moments with her ex. She reported she feels like she is being manipulated, but also pointed out she is torn about distancing herself for 6 months because of the 59 yo daughter that she has helped raise for the last 6 years. She admitted that part of her frustration comes from the fact that her ex is drinking more than ever and strung out on opiates. She provided good feedback and support to another group member who was in considerable pain over her own deteriorating marriage. When asked about what she absolutely needs to do to support her recovery, the patient identified that 'going to meetings' is essential. She reported that when she is no longer in this program, it would be easy to stop doing these very things that have kept her sober. The patient admitted that many of the things listed on the characteristics of "unhealthy" relationships were very familiar with her. She made some good comments and continues to make excellent progress in her recovery. The patient's sobriety date is 11/5.    Family Program: Family present? No   Name of family member(s):   UDS collected: Yes Results: not back from lab  AA/NA attended?: YesSaturday and Sunday  Sponsor?: Yes   Torris House, LCAS

## 2014-02-24 ENCOUNTER — Encounter (HOSPITAL_COMMUNITY): Payer: Self-pay | Admitting: Psychology

## 2014-02-24 NOTE — Progress Notes (Signed)
    Daily Group Progress Note  Program: CD-IOP   Group Time: 1-2:30 pm  Participation Level: Active  Behavioral Response: Appropriate and Sharing  Type of Therapy: Process Group  Topic: Process: the first part of group was spent in process. Members shared about current issues and concerns, including the upcoming holiday weekend. One member returned after having carpal tunnel surgery on Monday. He expressed concern about his son and the degree to which his son reminds him of himself and his early drinking days. The patient asked the group what he might do to express his concerns?  Group members provided good feedback to this member and gave him some good ideas to consider. Fortunately, everyone, including this man, agreed that he could not stop or change his son's drinking patterns. Drug tests results were returned and one member denied drinking since 10 days ago despite increased alcohol readings from 2 drug tests.   Group Time: 2:45- 4 pm  Participation Level: Active  Behavioral Response: Sharing  Type of Therapy: Psycho-education Group  Topic: Psycho-Ed: the second half of group was spent in a psycho-ed on changing thinking, attitudes and behaviors. Members identified the way they had thought, the attitude they had and their behaviors while in their active addictions. These were juxtaposed with what they should be thinking, the attitude they have and the behaviors they display in recovery. The contrast was striking. Group members were asked to identify what the clich in recovery means by, "Fake it till you make it". The session was lively and engaging and all of the group members shared their thoughts and feelings about this topic. The session concluded with an emphasis on maintaining one's structure and schedule as much as possible over this holiday weekend.    Summary: The patient reported she had worked yesterday and is enjoying being back. She provided good feedback to another  group member, displaying good insight on her part. The patient reported she had been upset yesterday and decided to journal. She also wrote a poem. The patient agreed to read it to the group and it was very poignant. One member admitted he was very touched and felt the pain expressed in her poem. The patient wrote she is working on a 'forgiveness letter', but instead of writing to her father, she admitted it was to her ex. There patient reported she was going to a meeting this evening after group. It is her favorite AA meeting of the week. She also spoke with her sponsor yesterday. In the second half of group, the patient agreed that promiscuity was something she had displayed while drinking and drugging. She also described having the "fu__ it's" when she is using. Other members agreed with this observation. When she is clean and sober and doing well, though, she identified with caring for others as well as her own self-care. The patient continues to make excellent progress in her recovery and responded well to this intervention. This woman has become a very powerful and respected member of the group and her insight into what is important and necessary for sobriety is incredible. Her sobriety date remains 11/5.    Family Program: Family present? No   Name of family member(s):   UDS collected: No Results:   AA/NA attended?: YesTuesday  Sponsor?: Yes   Beverly Terrell, LCAS

## 2014-02-24 NOTE — Progress Notes (Signed)
    Daily Group Progress Note  Program: CD-IOP   Group Time: 1-2:30 pm  Participation Level: Active  Behavioral Response: Sharing  Type of Therapy: Process Group  Topic: Process: The first part of group was spent in process. Members checked-in and shared about current issues and concerns. One member shared that she was going to be absent from the next session, but she would come back Monday, but then leave again for 2 weeks. This disclosure produced numerous comments and feedback. A new group member was present and he introduced himself and described what had brought him here. As he was sharing, a group member arrived late and was in clear discomfort and emotional pain. I interrupted the new patient's disclosure and inquired about this recently arrived member. He shared about some painful realizations, but also that he had drunk a bottle of wine last night. At the appropriate time, a 15 minute break followed this first half of group. Drug tests were collected from all members today.  Group Time: 2:45- 4pm  Participation Level: Active  Behavioral Response: Appropriate and Sharing  Type of Therapy: Psycho-education Group  Topic: Psycho-Ed/Graduation: In the second half of group, at the conclusion of the check-in and more disclosures from the group member who arrived late, a psycho-ed followed. Members were asked to identify what they could and could not change. Group members were very grounded in their responses, especially the new member. All members were encouraged to focus on what they can change. At the conclusion of the group, a graduation ceremony was held honoring a successfully graduating member. Members shared kind and respectful feelings about the graduating member. He responded with warmth and gratitude for this time together. He leaves the program agreeing that he will return with 6 months of sobriety and speak to the group at that time.   Summary: The patient reported she had  worked yesterday and it, "felt good". She had been gone for over a month and everyone had been very welcoming and validating. She was very pleased with being back at work. The patient reported she had attended an [place] meeting and met with her sponsor before the meeting. The patient expressed concerns about the member who announced she was leaving for 2 weeks. She noted that this patient had made good progress, but still had work to do. She felt as if she was leaving before she was ready. In the second half of group, the patient provided good feedback and displayed good insight into her recovery needs. When asked about what she could not change, the patient stated that she couldn't change the fact that her father had abandoned her and her family when she was 33 yo. But when asked about what she could change, the patient reported that she could forgive her father and let him go. These were powerful disclosures from this patient. During the graduating ceremony, the patient expressed her respect for the graduating member and admitted she wished he wasn't leaving. She is making good progress in her recovery and appears to be making good progress in the deeper issues that have been fueling her drinking and drugging. Her sobriety date remains 11/5.    Family Program: Family present? No   Name of family member(s):   UDS collected: Yes Results: negative  AA/NA attended?: YesTuesday  Sponsor?: Yes   Beverly Terrell, LCAS

## 2014-02-28 ENCOUNTER — Other Ambulatory Visit (HOSPITAL_COMMUNITY): Payer: No Typology Code available for payment source | Admitting: Psychology

## 2014-02-28 DIAGNOSIS — F1020 Alcohol dependence, uncomplicated: Secondary | ICD-10-CM

## 2014-02-28 DIAGNOSIS — F1220 Cannabis dependence, uncomplicated: Secondary | ICD-10-CM

## 2014-02-28 DIAGNOSIS — F4310 Post-traumatic stress disorder, unspecified: Secondary | ICD-10-CM

## 2014-03-01 ENCOUNTER — Encounter (HOSPITAL_COMMUNITY): Payer: Self-pay | Admitting: Psychology

## 2014-03-01 NOTE — Progress Notes (Signed)
    Daily Group Progress Note  Program: CD-IOP   Group Time: 1-2:30 pm  Participation Level: Active  Behavioral Response: Appropriate and Sharing  Type of Therapy: Process Group  Topic: Group Process: The first part of group was spent in a lengthy process. Members shared about their holiday weekend and what they had done since we last met 5 days ago. A number of members had had tough times and had struggled over the holiday weekend. They shared in length about their feelings and the pain and frustration of being with or without family and loved ones. There was good disclosure and feedback among the group.   Group Time: 2:45- 4pm  Participation Level: Active  Behavioral Response: Appropriate  Type of Therapy: Process Group  Topic: Process Continued; the second part of group was brief. The first part had carried over long after our usual break. One member was provided with his drug test results, which was positive for THC. This represented the final positive UA allowed per the program. Despite the increased THC level and confirmation by the doctor at the lab in [place], the patient denied using anything. He had no intention of admitting any cannabis use. Another member was leaving after today. He had also relapsed a 3rd time. He shared kind thoughts with the group and how much he had benefitted from the program. At the same time, he reminded the group it was up to him now about what choices he would make. He admitted that drinking would make it impossible to have the life he hopes to have. Words of hope and gratitude were shared to these 2 members leaving our program.  Summary: The patient reported she had had a pretty good holiday. She had spent some time with her family and some other friends. She reported that she had gone to the gym 3 times, but had not gone to any AA meetings. The patient admitted, "I didn't want to talk about recovery or being an alcoholic and just wanted a break". She was  not the only one who avoided meetings. The patient provided good feedback to her fellow group members. She reminded another female group member that she had also suffered heartbreak when her Dad abandoned her family when she was 23 yo. The patient displayed good insight and acceptance as she explained that she had come to accept her father does not want to be in her life. She has come to the place where it isn't hurting her any longer and she doesn't expect anything from him now. The patient shared kind words with the members who are leaving after today. She expressed that she would miss them both. This patient has made excellent progress and displays a degree of insight and understanding that is transformative and amazing. The patient responded well to this intervention and her sobriety date remains 11/5.    Family Program: Family present? No   Name of family member(s):   UDS collected: No Results:   AA/NA attended?: No  Sponsor?: Yes   Jamiah Homeyer, LCAS

## 2014-03-02 ENCOUNTER — Other Ambulatory Visit (HOSPITAL_COMMUNITY): Payer: No Typology Code available for payment source

## 2014-03-03 ENCOUNTER — Telehealth (HOSPITAL_COMMUNITY): Payer: Self-pay | Admitting: Psychology

## 2014-03-03 ENCOUNTER — Encounter (HOSPITAL_COMMUNITY): Payer: Self-pay

## 2014-03-07 ENCOUNTER — Other Ambulatory Visit (HOSPITAL_COMMUNITY): Payer: No Typology Code available for payment source | Attending: Psychiatry | Admitting: Psychology

## 2014-03-07 DIAGNOSIS — F1220 Cannabis dependence, uncomplicated: Secondary | ICD-10-CM

## 2014-03-07 DIAGNOSIS — F1020 Alcohol dependence, uncomplicated: Secondary | ICD-10-CM

## 2014-03-07 DIAGNOSIS — Z811 Family history of alcohol abuse and dependence: Secondary | ICD-10-CM

## 2014-03-07 DIAGNOSIS — F17210 Nicotine dependence, cigarettes, uncomplicated: Secondary | ICD-10-CM | POA: Insufficient documentation

## 2014-03-07 DIAGNOSIS — F4310 Post-traumatic stress disorder, unspecified: Secondary | ICD-10-CM

## 2014-03-07 DIAGNOSIS — Z6372 Alcoholism and drug addiction in family: Secondary | ICD-10-CM

## 2014-03-09 ENCOUNTER — Other Ambulatory Visit (HOSPITAL_COMMUNITY): Payer: No Typology Code available for payment source | Admitting: Psychology

## 2014-03-09 ENCOUNTER — Encounter (HOSPITAL_COMMUNITY): Payer: Self-pay | Admitting: Psychology

## 2014-03-09 DIAGNOSIS — F1020 Alcohol dependence, uncomplicated: Secondary | ICD-10-CM | POA: Diagnosis not present

## 2014-03-09 DIAGNOSIS — Z6372 Alcoholism and drug addiction in family: Secondary | ICD-10-CM

## 2014-03-09 DIAGNOSIS — Z811 Family history of alcohol abuse and dependence: Secondary | ICD-10-CM

## 2014-03-09 DIAGNOSIS — F1220 Cannabis dependence, uncomplicated: Secondary | ICD-10-CM
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
## 2014-03-10 ENCOUNTER — Encounter (HOSPITAL_COMMUNITY): Payer: Self-pay | Admitting: Psychology

## 2014-03-10 NOTE — Progress Notes (Signed)
    Daily Group Progress Note  Program: CD-IOP   Group Time: 1-2:30 pm  Participation Level: Active  Behavioral Response: Appropriate  Type of Therapy: Process Group  Topic: Group Process: the first part of group was spent in process. Members were directed to identify what they had done to support their recovery since the last group session. One member reported she would be picking up her 60 day chip tonight. The group applauded this news. Another member shared that he had attended 4 AA meetings since our last group session, less than 48 hours ago. More applause from the group was provided. A member was asked to read the Daily Reflection. It was titled, "The Victory of Surrender". This reading tied in with the foundational aspect of recovery, which is surrendering to this powerlessness of this disease. Drug tests were collected from 3 group members while the result of another drug test was returned to the member. He was questioned at length due to the falsified nature of the results. The group member denied having added Suboxone to his drug test.   Group Time: 2:45- 4pm  Participation Level: Active  Behavioral Response: Sharing  Type of Therapy: Psycho-education Group  Topic: Psycho-Ed: The second half of group was spent in a psycho-ed on Feelings. Members were provided handouts on Feelings. The first page was covered with different faces reflecting different feelings. Members identified what feelings they were having right at this moment. These disclosures brought a variety of expressions followed by further explanations. The importance of being able to identify what one is feelings was emphasized as well as the realization that feelings don't have to be acted on and sometimes we feel sad or discouraged or lonely and that is okay too. There was good feedback and some disclosures that proved much more insight into some of these members' inner lives.   Summary: The patient reported she had  had time off from work and had enjoyed the down time over the holiday. She had been to meetings and the gym. She is still working through her feelings about her ex-partner and is confused about what decision to make. The patient admitted she must be very careful because she gets sucked back-in and recognizes that the ex is not good for her. She reported she is excited about graduating successful from the program on Friday. She also admitted she will miss the group and has learned a lot from the time she has spent here. When asked to identify her feelings from the Feeling Chart, she reported her feelings were conscious and content with her life today. She made some good comments and provided excellent feedback to her fellow group members. Her sobriety date remains 10/23.   Family Program: Family present? No   Name of family member(s):   UDS collected: Yes Results: not back from lab yet  AA/NA attended?: YesTuesday  Sponsor?: Yes   Nysir Fergusson, LCAS

## 2014-03-11 ENCOUNTER — Encounter (HOSPITAL_COMMUNITY): Payer: Self-pay | Admitting: Medical

## 2014-03-11 ENCOUNTER — Encounter (HOSPITAL_COMMUNITY): Payer: Self-pay | Admitting: Psychology

## 2014-03-11 ENCOUNTER — Other Ambulatory Visit (HOSPITAL_COMMUNITY): Payer: No Typology Code available for payment source | Admitting: Psychology

## 2014-03-11 DIAGNOSIS — F1020 Alcohol dependence, uncomplicated: Secondary | ICD-10-CM | POA: Diagnosis not present

## 2014-03-11 NOTE — Progress Notes (Signed)
  [facility]   Beverly Terrell [id number]  Date of Admission: 01/07/2014 Date of Discharge: 03/11/2014  Course of Treatment: Drank prior to first attendance so  metabolite test on UDS was + for ETOH.After beginning the CD IOP her attendance and participation were exceptional and all drug screens were negative.The Board of Nursing were satisfied with her treatment and did not recommend any further actions.She has had difficulty ending an unhealthy long term relationship and she has been referred to [facility]/ACOA to help with her "codependency" issues as part of her aftercare plan.  Her prognosis is "guarded "at this time but if she continues with her plan it will improve to "good" .  Goals and Activities to Help Maintain Sobriety: 1. Stay away from old friends who continue to drink and use mind-altering chemicals. 2. Continue practicing Fair Fighting rules in interpersonal conflicts. 3. Continue alcohol and drug refusal skills and call on support systems. 4. Recommend attending [facility]on/ACOA meetings and reading Rick DuffJanet Woititz Adult Children of Alcoholics  Referrals: None   Aftercare services:  1.  Attend AA  Meetings 4  times per week. 2. Continue with a sponsor and a home group  3. Continue with EAP Next appointment: 03/22/2014  Plan of Action to Address Continuing Problems: as above    Client has participated in the development of this discharge plan and has received a copy of this completed plan  Court JoyKOBER, Beverly Terrell  03/11/2014   Court JoyKOBER, Beverly Betancur E, PA-C 03/11/2014

## 2014-03-14 ENCOUNTER — Encounter (HOSPITAL_COMMUNITY): Payer: Self-pay | Admitting: Psychology

## 2014-03-14 ENCOUNTER — Other Ambulatory Visit (HOSPITAL_COMMUNITY): Payer: No Typology Code available for payment source

## 2014-03-14 DIAGNOSIS — F1020 Alcohol dependence, uncomplicated: Secondary | ICD-10-CM

## 2014-03-14 NOTE — Addendum Note (Signed)
Addended byLogan Bores: Sana Tessmer on: 03/14/2014 08:10 AM   Modules accepted: Medications

## 2014-03-14 NOTE — Progress Notes (Signed)
Daily Group Progress Note  Program: CD-IOP   Group Time: 1-2:30 pm  Participation Level: Active  Behavioral Response: Appropriate and Sharing  Type of Therapy: Process Group  Topic: Process: The first part of group was spent in process. Members shared about current issues and concerns in early recovery. Group members compared the ways they are interacting with others now as opposed to how they used to behave and interact while in their active addiction. There was good feedback and discussion among the group. During this session, the program director met with the new member for his first session along with the discharging member.  Group Time: 2:45- 4pm  Participation Level: Active  Behavioral Response: Appropriate and Sharing  Type of Therapy: Psycho-education Group  Topic: Psycho-Ed: Interrupting Addictive Preoccupation/Graduation. The second half of group was spent in a psycho-ed on planning for potential relapse through 'addictive preoccupation'. Members were provided with a handout on the process of preoccupation from euphoric recall to positive expectancy to obsession and finally compulsion. Members took turns reading the handout and offered examples of how they engaged in euphoric recall and how, in turn, those memories fed into their obsession and compulsion to drink/drug. The insanity of this process and subsequent relapse was discussed at length. As the session neared an end, the psycho-ed was halted and the Graduation ceremony was observed. Members ate brownies and spoke highly of the graduating member as the graduating medallion was passed around the room. The graduating member recounted her time here and admitted she will miss being here in the group and that she will pray for them all in the days ahead.   Summary: The patient was attentive, but quiet in the first part of group. She had gone to an [place] yesterday evening after working a full day and then going to the  gym. She had worked a half day today and was dressed in her scrubs. She was excited to be graduating, but admitted that she will miss the group and the people here. The patient's comments in the second half of group displayed a good understanding of the importance of addressing thought about using quite quickly and not allowing them to take up space or grow stronger. During her graduation ceremony, the patient recounted the feelings she had when she first appeared here in group and how much has changed for her in this short time. She noted that she should have recognized a problem when she was 34 yo and ended up in the hospital with a BAC of .40. But it didn't stop her for long and after a few months of sobriety, she was back to partying and her old ways. The patient assured her fellow group members that she would not forget them and that they would be in her prayers. She thanked them and urged them to remain vigilant and work their program. The patient has done very well in the program and displayed some significant changes in her thinking and attitudes since she first arrived. She will be returning to work full-time and has been cleared for all duties. She remained alcohol and drug-free since entering the program and leaves [place] today having successfully completed the program. Her sobriety date remains 11/5. While here, she also picked up her 60 day chip.    Family Program: Family present? No   Name of family member(s):   UDS collected: No Results:   AA/NA attended?: YesWednesday and Thursday  Sponsor?: Yes   Jesten Cappuccio, LCAS

## 2014-03-16 ENCOUNTER — Other Ambulatory Visit (HOSPITAL_COMMUNITY): Payer: No Typology Code available for payment source

## 2014-03-18 ENCOUNTER — Other Ambulatory Visit (HOSPITAL_COMMUNITY): Payer: No Typology Code available for payment source

## 2014-03-21 ENCOUNTER — Encounter: Payer: Self-pay | Admitting: Psychiatry

## 2014-03-21 ENCOUNTER — Other Ambulatory Visit (HOSPITAL_COMMUNITY): Payer: No Typology Code available for payment source

## 2014-03-23 ENCOUNTER — Other Ambulatory Visit (HOSPITAL_COMMUNITY): Payer: No Typology Code available for payment source

## 2014-03-25 ENCOUNTER — Other Ambulatory Visit (HOSPITAL_COMMUNITY): Payer: No Typology Code available for payment source

## 2014-03-28 ENCOUNTER — Other Ambulatory Visit (HOSPITAL_COMMUNITY): Payer: No Typology Code available for payment source

## 2014-03-30 ENCOUNTER — Other Ambulatory Visit (HOSPITAL_COMMUNITY): Payer: No Typology Code available for payment source

## 2014-04-06 ENCOUNTER — Encounter: Payer: Self-pay | Admitting: Psychiatry

## 2016-03-27 DIAGNOSIS — N898 Other specified noninflammatory disorders of vagina: Secondary | ICD-10-CM | POA: Diagnosis not present

## 2016-07-03 DIAGNOSIS — M546 Pain in thoracic spine: Secondary | ICD-10-CM | POA: Diagnosis not present

## 2016-07-03 DIAGNOSIS — G8929 Other chronic pain: Secondary | ICD-10-CM | POA: Diagnosis not present

## 2016-12-02 HISTORY — PX: BREAST REDUCTION SURGERY: SHX8

## 2017-02-28 DIAGNOSIS — S21002A Unspecified open wound of left breast, initial encounter: Secondary | ICD-10-CM | POA: Diagnosis not present

## 2017-10-15 DIAGNOSIS — F410 Panic disorder [episodic paroxysmal anxiety] without agoraphobia: Secondary | ICD-10-CM | POA: Diagnosis not present

## 2017-10-20 ENCOUNTER — Encounter: Payer: Self-pay | Admitting: Obstetrics and Gynecology

## 2017-10-20 ENCOUNTER — Ambulatory Visit (INDEPENDENT_AMBULATORY_CARE_PROVIDER_SITE_OTHER): Payer: BLUE CROSS/BLUE SHIELD | Admitting: Obstetrics and Gynecology

## 2017-10-20 ENCOUNTER — Other Ambulatory Visit (HOSPITAL_COMMUNITY)
Admission: RE | Admit: 2017-10-20 | Discharge: 2017-10-20 | Disposition: A | Discharge status: Home / Self Care | Payer: BLUE CROSS/BLUE SHIELD | Source: Ambulatory Visit | Attending: Obstetrics and Gynecology | Admitting: Obstetrics and Gynecology

## 2017-10-20 VITALS — BP 100/64 | HR 76 | Ht 64.0 in | Wt 155.0 lb

## 2017-10-20 DIAGNOSIS — D2262 Melanocytic nevi of left upper limb, including shoulder: Secondary | ICD-10-CM | POA: Diagnosis not present

## 2017-10-20 DIAGNOSIS — Z1151 Encounter for screening for human papillomavirus (HPV): Secondary | ICD-10-CM | POA: Diagnosis not present

## 2017-10-20 DIAGNOSIS — D2271 Melanocytic nevi of right lower limb, including hip: Secondary | ICD-10-CM | POA: Diagnosis not present

## 2017-10-20 DIAGNOSIS — L821 Other seborrheic keratosis: Secondary | ICD-10-CM | POA: Diagnosis not present

## 2017-10-20 DIAGNOSIS — Z124 Encounter for screening for malignant neoplasm of cervix: Secondary | ICD-10-CM | POA: Insufficient documentation

## 2017-10-20 DIAGNOSIS — D2261 Melanocytic nevi of right upper limb, including shoulder: Secondary | ICD-10-CM | POA: Diagnosis not present

## 2017-10-20 DIAGNOSIS — Z01419 Encounter for gynecological examination (general) (routine) without abnormal findings: Secondary | ICD-10-CM | POA: Diagnosis not present

## 2017-10-20 NOTE — Patient Instructions (Signed)
I value your feedback and entrusting us with your care. If you get a [facility] patient survey, I would appreciate you taking the time to let us know about your experience today. Thank you! 

## 2017-10-20 NOTE — Progress Notes (Signed)
PCP:  Patient, No Pcp Per   Chief Complaint  Patient presents with  . Gynecologic Exam     HPI:      [name] is a 37 y.o. G0P0000 who LMP was Patient's last menstrual period was 09/26/2017 (approximate)., presents today for her annual examination.  Her menses are regular every 28-30 days, lasting 5 days.  Dysmenorrhea mild, occurring first 1-2 days of flow. She does not have intermenstrual bleeding.  Sex activity: has sex with females.  Last Pap: August 24, 2013  Results were: no abnormalities  Hx of STDs: none  There is no FH of breast cancer. There is no FH of ovarian cancer. The patient does do self-breast exams.  Tobacco use: The patient denies current or previous tobacco use. Alcohol use: none No drug use.  Exercise: moderately active  She does get adequate calcium but not Vitamin D in her diet.   Past Medical History:  Diagnosis Date  . BV (bacterial vaginosis)   . Depression   . Liver disease     Past Surgical History:  Procedure Laterality Date  . BREAST REDUCTION SURGERY  12/2016    Family History  Problem Relation Age of Onset  . Renal Disease Maternal Grandmother   . Uterine cancer Maternal Grandmother   . Heart failure Maternal Grandmother   . Diabetes Maternal Grandfather   . Bladder Cancer Maternal Grandfather   . Alcohol abuse Paternal Grandfather     Social History   Socioeconomic History  . Marital status: Single    Spouse name: Not on file  . Number of children: Not on file  . Years of education: Not on file  . Highest education level: Not on file  Occupational History  . Not on file  Social Needs  . Financial resource strain: Not on file  . Food insecurity:    Worry: Not on file    Inability: Not on file  . Transportation needs:    Medical: Not on file    Non-medical: Not on file  Tobacco Use  . Smoking status: Former Smoker    Packs/day: 0.50  . Smokeless tobacco: Never Used  Substance and Sexual Activity  . Alcohol  use: Not Currently    Alcohol/week: 18.0 - 20.0 standard drinks    Types: 8 - 10 Cans of beer, 10 Shots of liquor per week  . Drug use: Not Currently    Types: Marijuana, Cocaine, Benzodiazepines  . Sexual activity: Yes    Birth control/protection: None  Lifestyle  . Physical activity:    Days per week: Not on file    Minutes per session: Not on file  . Stress: Not on file  Relationships  . Social connections:    Talks on phone: Not on file    Gets together: Not on file    Attends religious service: Not on file    Active member of club or organization: Not on file    Attends meetings of clubs or organizations: Not on file    Relationship status: Not on file  . Intimate partner violence:    Fear of current or ex partner: Not on file    Emotionally abused: Not on file    Physically abused: Not on file    Forced sexual activity: Not on file  Other Topics Concern  . Not on file  Social History Narrative  . Not on file    Outpatient Medications Prior to Visit  Medication Sig Dispense Refill  .  escitalopram (LEXAPRO) 10 MG tablet Take 10 mg by mouth daily.    Marland Kitchen. LORazepam (ATIVAN) 0.5 MG tablet Take by mouth.    . ranitidine (ZANTAC) 150 MG tablet Take by mouth.    Marland Kitchen. azithromycin (ZITHROMAX) 250 MG tablet   0  . Multiple Vitamin (MULTI-VITAMINS) TABS Take by mouth.     No facility-administered medications prior to visit.       ROS:  Review of Systems  Constitutional: Negative for fatigue, fever and unexpected weight change.  Respiratory: Negative for cough, shortness of breath and wheezing.   Cardiovascular: Negative for chest pain, palpitations and leg swelling.  Gastrointestinal: Negative for blood in stool, constipation, diarrhea, nausea and vomiting.  Endocrine: Negative for cold intolerance, heat intolerance and polyuria.  Genitourinary: Negative for dyspareunia, dysuria, flank pain, frequency, genital sores, hematuria, menstrual problem, pelvic pain, urgency,  vaginal bleeding, vaginal discharge and vaginal pain.  Musculoskeletal: Negative for back pain, joint swelling and myalgias.  Skin: Negative for rash.  Neurological: Negative for dizziness, syncope, light-headedness, numbness and headaches.  Hematological: Negative for adenopathy.  Psychiatric/Behavioral: Negative for agitation, confusion, sleep disturbance and suicidal ideas. The patient is not nervous/anxious.    BREAST: No symptoms   Objective: BP 100/64   Pulse 76   Ht 5\' 4"  (1.626 m)   Wt 155 lb (70.3 kg)   LMP 09/26/2017 (Approximate)   BMI 26.61 kg/m    Physical Exam  Constitutional: She is oriented to person, place, and time. She appears well-developed and well-nourished.  Genitourinary: Vagina normal and uterus normal. There is no rash or tenderness on the right labia. There is no rash or tenderness on the left labia. No erythema or tenderness in the vagina. No vaginal discharge found. Right adnexum does not display mass and does not display tenderness. Left adnexum does not display mass and does not display tenderness. Cervix does not exhibit motion tenderness or polyp. Uterus is not enlarged or tender.  Neck: Normal range of motion. No thyromegaly present.  Cardiovascular: Normal rate, regular rhythm and normal heart sounds.  No murmur heard. Pulmonary/Chest: Effort normal and breath sounds normal. Right breast exhibits no mass, no nipple discharge, no skin change and no tenderness. Left breast exhibits no mass, no nipple discharge, no skin change and no tenderness.  Abdominal: Soft. There is no tenderness. There is no guarding.  Musculoskeletal: Normal range of motion.  Neurological: She is alert and oriented to person, place, and time. No cranial nerve deficit.  Psychiatric: She has a normal mood and affect. Her behavior is normal.  Vitals reviewed.   Assessment/Plan: Encounter for annual routine gynecological examination  Cervical cancer screening - Plan: Cytology -  PAP  Screening for HPV (human papillomavirus) - Plan: Cytology - PAP           GYN counsel adequate intake of calcium and vitamin D, diet and exercise     F/U  Return in about 1 year (around 10/21/2018).  Xayvier Vallez B. Draeden Kellman, PA-C 10/20/2017 11:37 AM

## 2017-10-21 LAB — CYTOLOGY - PAP
Diagnosis: NEGATIVE
HPV: NOT DETECTED

## 2017-11-10 DIAGNOSIS — F410 Panic disorder [episodic paroxysmal anxiety] without agoraphobia: Secondary | ICD-10-CM | POA: Diagnosis not present

## 2018-05-31 ENCOUNTER — Emergency Department: Payer: BLUE CROSS/BLUE SHIELD

## 2018-05-31 ENCOUNTER — Emergency Department
Admission: EM | Admit: 2018-05-31 | Discharge: 2018-05-31 | Disposition: A | Discharge status: Home / Self Care | Payer: BLUE CROSS/BLUE SHIELD | Attending: Emergency Medicine | Admitting: Emergency Medicine

## 2018-05-31 ENCOUNTER — Other Ambulatory Visit: Payer: Self-pay

## 2018-05-31 DIAGNOSIS — Y999 Unspecified external cause status: Secondary | ICD-10-CM | POA: Insufficient documentation

## 2018-05-31 DIAGNOSIS — S0993XA Unspecified injury of face, initial encounter: Secondary | ICD-10-CM | POA: Diagnosis not present

## 2018-05-31 DIAGNOSIS — W230XXA Caught, crushed, jammed, or pinched between moving objects, initial encounter: Secondary | ICD-10-CM | POA: Insufficient documentation

## 2018-05-31 DIAGNOSIS — S60032A Contusion of left middle finger without damage to nail, initial encounter: Secondary | ICD-10-CM | POA: Insufficient documentation

## 2018-05-31 DIAGNOSIS — Y939 Activity, unspecified: Secondary | ICD-10-CM | POA: Diagnosis not present

## 2018-05-31 DIAGNOSIS — S0181XA Laceration without foreign body of other part of head, initial encounter: Secondary | ICD-10-CM

## 2018-05-31 DIAGNOSIS — W19XXXA Unspecified fall, initial encounter: Secondary | ICD-10-CM

## 2018-05-31 DIAGNOSIS — Z87891 Personal history of nicotine dependence: Secondary | ICD-10-CM | POA: Insufficient documentation

## 2018-05-31 DIAGNOSIS — S01511A Laceration without foreign body of lip, initial encounter: Secondary | ICD-10-CM | POA: Insufficient documentation

## 2018-05-31 DIAGNOSIS — S6992XA Unspecified injury of left wrist, hand and finger(s), initial encounter: Secondary | ICD-10-CM | POA: Diagnosis not present

## 2018-05-31 DIAGNOSIS — Y92018 Other place in single-family (private) house as the place of occurrence of the external cause: Secondary | ICD-10-CM | POA: Insufficient documentation

## 2018-05-31 DIAGNOSIS — Z79899 Other long term (current) drug therapy: Secondary | ICD-10-CM | POA: Insufficient documentation

## 2018-05-31 DIAGNOSIS — M7989 Other specified soft tissue disorders: Secondary | ICD-10-CM | POA: Diagnosis not present

## 2018-05-31 MED ORDER — MELOXICAM 15 MG PO TABS: 15.0000 mg | ORAL_TABLET | Freq: Every day | ORAL | 1 refills | Status: AC

## 2018-05-31 NOTE — ED Provider Notes (Signed)
[facility] Emergency Department Provider Note  ____________________________________________  Time seen: Approximately 9:17 PM  I have reviewed the triage vital signs and the nursing notes.   HISTORY  Chief Complaint Fall and Finger Injury    HPI Beverly Terrell is a 38 y.o. female presents to the emergency department with 2 medical complaints.  Patient reports that she was playing kickball earlier in the day and lost her balance and fell, causing 0.25 cm laceration left upper lip.  Patient denies loss of consciousness or headache.  She denies neck pain.  No numbness or tingling in the upper extremities.  Patient reports that she has not sought care for laceration and states that while she was leaving her mother's house she shot her left middle finger in the door.  Patient has had difficulty moving left third digit since incident..  She has ecchymosis along the dorsal aspect of the digit.  No left hand laceration.  No other alleviating measures have been attempted.        Past Medical History:  Diagnosis Date  . BV (bacterial vaginosis)   . Depression   . Liver disease     Patient Active Problem List   Diagnosis Date Noted  . Alcohol dependence with physiological dependence, continuous (HCC) 01/07/2014  . 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 Cannabis dependence, continuous abuse (HCC) 01/07/2014  . Family history of alcoholism in paternal grandfather 01/07/2014  . PTSD (post-traumatic stress disorder) 01/07/2014  . Polysubstance abuse (HCC) 01/07/2014    Past Surgical History:  Procedure Laterality Date  . BREAST REDUCTION SURGERY  12/2016    Prior to Admission medications   Medication Sig Start Date End Date Taking? Authorizing Provider  escitalopram (LEXAPRO) 10 MG tablet Take 10 mg by mouth daily.    [provider]  meloxicam (MOBIC) 15 MG tablet Take 1 tablet (15 mg total) by mouth daily for 7 days. 05/31/18 06/07/18  Orvil Feil, PA-C  ranitidine (ZANTAC) 150 MG tablet Take by mouth.    [provider]    Allergies Patient has no known allergies.  Family History  Problem Relation Age of Onset  . Renal Disease Maternal Grandmother   . Uterine cancer Maternal Grandmother   . Heart failure Maternal Grandmother   . Diabetes Maternal Grandfather   . Bladder Cancer Maternal Grandfather   . Alcohol abuse Paternal Grandfather     Social History Social History   Tobacco Use  . Smoking status: Former Smoker    Packs/day: 0.50  . Smokeless tobacco: Never Used  Substance Use Topics  . Alcohol use: Not Currently    Alcohol/week: 18.0 - 20.0 standard drinks    Types: 8 - 10 Cans of beer, 10 Shots of liquor per week  . Drug use: Not Currently    Types: Marijuana, Cocaine, Benzodiazepines     Review of Systems  Constitutional: No fever/chills Eyes: No visual changes. No discharge ENT: No upper respiratory complaints. Cardiovascular: no chest pain. Respiratory: no cough. No SOB. Gastrointestinal: No abdominal pain.  No nausea, no vomiting.  No diarrhea.  No constipation. Genitourinary: Negative for dysuria. No hematuria Musculoskeletal: Patient has left middle finger pain.  Skin: Patient has facial laceration.  Neurological: Negative for headaches, focal weakness or numbness.   ____________________________________________   PHYSICAL EXAM:  VITAL SIGNS: ED Triage Vitals  Enc Vitals Group     BP 05/31/18 2104 (!) 148/94     Pulse Rate 05/31/18 2104 85  Resp 05/31/18 2104 18     Temp 05/31/18 2104 98.1 F (36.7 C)     Temp Source 05/31/18 2104 Oral     SpO2 05/31/18 2104 97 %     Weight 05/31/18 2105 130 lb (59 kg)     Height 05/31/18 2105 5\' 4"  (1.626 m)     Head Circumference --      Peak Flow --      Pain Score 05/31/18 2105 4     Pain Loc --      Pain Edu? --      Excl. in GC? --      Constitutional: Alert and oriented. Well appearing and in no acute  distress. Eyes: Conjunctivae are normal. PERRL. EOMI. Head: Atraumatic. ENT:      Ears: TMs are pearly.       Nose: No congestion/rhinnorhea.      Mouth/Throat: Mucous membranes are moist.  Neck: No stridor.  No cervical spine tenderness to palpation. Cardiovascular: Normal rate, regular rhythm. Normal S1 and S2.  Good peripheral circulation. Respiratory: Normal respiratory effort without tachypnea or retractions. Lungs CTAB. Good air entry to the bases with no decreased or absent breath sounds. Gastrointestinal: Bowel sounds 4 quadrants. Soft and nontender to palpation. No guarding or rigidity. No palpable masses. No distention. No CVA tenderness. Musculoskeletal: No flexor or extensor tendon deficits appreciated with testing of left middle finger.  Ecchymosis visualized at dorsal aspect of left third digit.  Palpable radial pulse, left. Neurologic:  Normal speech and language. No gross focal neurologic deficits are appreciated.  Skin: Patient has 0.5 cm laceration at superior aspect of upper lip, triangular in shape.  Psychiatric: Mood and affect are normal. Speech and behavior are normal. Patient exhibits appropriate insight and judgement.   ____________________________________________   LABS (all labs ordered are listed, but only abnormal results are displayed)  Labs Reviewed - No data to display ____________________________________________  EKG   ____________________________________________  RADIOLOGY I personally viewed and evaluated these images as part of my medical decision making, as well as reviewing the written report by the radiologist.    Dg Hand Complete Left  Result Date: 05/31/2018 CLINICAL DATA:  Middle digit swelling after trauma. EXAM: LEFT HAND - COMPLETE 3+ VIEW COMPARISON:  None. FINDINGS: There is no evidence of fracture or dislocation. There is no evidence of arthropathy or other focal bone abnormality. Soft tissues are unremarkable. IMPRESSION:  Negative. Electronically Signed   By: Gerome Sam III M.D   On: 05/31/2018 21:50    ____________________________________________    PROCEDURES  Procedure(s) performed:    Procedures  LACERATION REPAIR Performed by: Orvil Feil Authorized by: Orvil Feil Consent: Verbal consent obtained. Risks and benefits: risks, benefits and alternatives were discussed Consent given by: patient Patient identity confirmed: provided demographic data Prepped and Draped in normal sterile fashion Wound explored  Laceration Location: Upper lip   Laceration Length: 0.5 cm  No Foreign Bodies seen or palpated  Irrigation method: syringe Amount of cleaning: standard  Skin closure: Dermabond   Patient tolerance: Patient tolerated the procedure well with no immediate complications.   Medications - No data to display   ____________________________________________   INITIAL IMPRESSION / ASSESSMENT AND PLAN / ED COURSE  Pertinent labs & imaging results that were available during my care of the patient were reviewed by me and considered in my medical decision making (see chart for details).  Review of the [place] CSRS was performed in accordance of the [facility] prior  to dispensing any controlled drugs.           Assessment and Plan:  Facial laceration Finger contusion Patient presents to the emergency department with a 0.25 cm laceration of upper lip repaired in the emergency department with Dermabond.  Patient is also complaining of left middle finger pain after she slammed her finger in a door while leaving her mother's house.  Patient's facial laceration was repaired in the emergency department with Dermabond.  No acute bony abnormalities were identified on x-ray examination of the left middle finger.  Patient's left middle finger was splinted into extension.  Patient was discharged with meloxicam for pain.  All patient questions were  answered.    ____________________________________________  FINAL CLINICAL IMPRESSION(S) / ED DIAGNOSES  Final diagnoses:  Fall, initial encounter  Facial laceration, initial encounter      NEW MEDICATIONS STARTED DURING THIS VISIT:  ED Discharge Orders         Ordered    meloxicam (MOBIC) 15 MG tablet  Daily     05/31/18 2214              This chart was dictated using voice recognition software/Dragon. Despite best efforts to proofread, errors can occur which can change the meaning. Any change was purely unintentional.    Orvil Feil, PA-C 05/31/18 2251    Jene Every, MD 06/05/18 [phone]

## 2018-05-31 NOTE — ED Triage Notes (Signed)
Patient reports playing kickball earlier today and fell.  Patient with slight bleeding from nose.  Reports as she was leaving her mother's house finger was shut in door (left middle finger).

## 2018-06-24 DIAGNOSIS — J029 Acute pharyngitis, unspecified: Secondary | ICD-10-CM | POA: Diagnosis not present

## 2018-06-24 DIAGNOSIS — R509 Fever, unspecified: Secondary | ICD-10-CM | POA: Diagnosis not present

## 2018-07-13 DIAGNOSIS — F410 Panic disorder [episodic paroxysmal anxiety] without agoraphobia: Secondary | ICD-10-CM | POA: Diagnosis not present

## 2018-07-13 DIAGNOSIS — Z0000 Encounter for general adult medical examination without abnormal findings: Secondary | ICD-10-CM | POA: Diagnosis not present

## 2018-07-29 ENCOUNTER — Emergency Department
Admission: EM | Admit: 2018-07-29 | Discharge: 2018-07-29 | Disposition: A | Discharge status: Home / Self Care | Payer: BLUE CROSS/BLUE SHIELD | Attending: Student in an Organized Health Care Education/Training Program | Admitting: Student in an Organized Health Care Education/Training Program

## 2018-07-29 ENCOUNTER — Other Ambulatory Visit: Payer: Self-pay

## 2018-07-29 DIAGNOSIS — Z87891 Personal history of nicotine dependence: Secondary | ICD-10-CM | POA: Diagnosis not present

## 2018-07-29 DIAGNOSIS — Z79899 Other long term (current) drug therapy: Secondary | ICD-10-CM | POA: Diagnosis not present

## 2018-07-29 DIAGNOSIS — M545 Low back pain, unspecified: Secondary | ICD-10-CM

## 2018-07-29 LAB — URINALYSIS, COMPLETE (UACMP) WITH MICROSCOPIC
Bacteria, UA: NONE SEEN
Bilirubin Urine: NEGATIVE
Glucose, UA: NEGATIVE mg/dL
Hgb urine dipstick: NEGATIVE
Ketones, ur: NEGATIVE mg/dL
Leukocytes,Ua: NEGATIVE
Nitrite: NEGATIVE
Protein, ur: 30 mg/dL — AB
Specific Gravity, Urine: 1.019 (ref 1.005–1.030)
pH: 8 (ref 5.0–8.0)

## 2018-07-29 MED ORDER — TIZANIDINE HCL 4 MG PO CAPS: 4.0000 mg | ORAL_CAPSULE | Freq: Three times a day (TID) | ORAL | 0 refills | Status: DC | PRN

## 2018-07-29 MED ORDER — ONDANSETRON 4 MG PO TBDP
4.0000 mg | ORAL_TABLET | Freq: Once | ORAL | Status: AC
  Administered 2018-07-29: 4 mg via ORAL
  Filled 2018-07-29: qty 1

## 2018-07-29 MED ORDER — KETOROLAC TROMETHAMINE 30 MG/ML IJ SOLN
30.0000 mg | Freq: Once | INTRAMUSCULAR | Status: AC
  Administered 2018-07-29: 10:00:00 30 mg via INTRAMUSCULAR
  Filled 2018-07-29: qty 1

## 2018-07-29 MED ORDER — ORPHENADRINE CITRATE 30 MG/ML IJ SOLN
60.0000 mg | Freq: Two times a day (BID) | INTRAMUSCULAR | Status: DC
  Administered 2018-07-29: 60 mg via INTRAMUSCULAR
  Filled 2018-07-29: qty 2

## 2018-07-29 MED ORDER — KETOROLAC TROMETHAMINE 10 MG PO TABS: 10.0000 mg | ORAL_TABLET | Freq: Four times a day (QID) | ORAL | 0 refills | Status: DC | PRN

## 2018-07-29 NOTE — ED Triage Notes (Signed)
Pt c/o right lower back pain that started out as a dull pain yesterday and today bent to get something and severe pain

## 2018-07-29 NOTE — ED Notes (Signed)

## 2018-07-29 NOTE — Discharge Instructions (Signed)
Rest and apply heat or ice to your lower back  Follow-up with your primary care provider or orthopedics for symptoms that are not improving over the next week or so.  Return to the emergency department for symptoms of change or worsen if unable to schedule an appointment.

## 2018-07-29 NOTE — ED Provider Notes (Signed)
[facility] Emergency Department Provider Note ____________________________________________  Time seen: Approximately 9:50 AM  I have reviewed the triage vital signs and the nursing notes.  HISTORY  Chief Complaint Back Pain   HPI Beverly Terrell is a 38 y.o. female who presents to the emergency department for treatment and evaluation of right side lower back pain.  Pain was present yesterday, but was dull and pretty nonspecific.  She states that today she bent over to pick something up and developed severe pain in that right side.  Pain does not radiate into the leg.  She denies any history of back pain similar to this. Past Medical History:  Diagnosis Date  . BV (bacterial vaginosis)   . Depression   . Liver disease     Patient Active Problem List   Diagnosis Date Noted  . Alcohol dependence with physiological dependence, continuous (HCC) 01/07/2014  . 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 Cannabis dependence, continuous abuse (HCC) 01/07/2014  . Family history of alcoholism in paternal grandfather 01/07/2014  . PTSD (post-traumatic stress disorder) 01/07/2014  . Polysubstance abuse (HCC) 01/07/2014    Past Surgical History:  Procedure Laterality Date  . BREAST REDUCTION SURGERY  12/2016    Prior to Admission medications   Medication Sig Start Date End Date Taking? Authorizing Provider  escitalopram (LEXAPRO) 10 MG tablet Take 10 mg by mouth daily.    [provider]  ketorolac (TORADOL) 10 MG tablet Take 1 tablet (10 mg total) by mouth every 6 (six) hours as needed. 07/29/18   Tayler Lassen, Rulon Eisenmenger B, FNP  ranitidine (ZANTAC) 150 MG tablet Take by mouth.    [provider]  tiZANidine (ZANAFLEX) 4 MG capsule Take 1 capsule (4 mg total) by mouth 3 (three) times daily as needed for muscle spasms. 07/29/18   Chinita Pester, FNP    Allergies Patient has no known allergies.  Family History  Problem Relation Age of Onset  .  Renal Disease Maternal Grandmother   . Uterine cancer Maternal Grandmother   . Heart failure Maternal Grandmother   . Diabetes Maternal Grandfather   . Bladder Cancer Maternal Grandfather   . Alcohol abuse Paternal Grandfather     Social History Social History   Tobacco Use  . Smoking status: Former Smoker    Packs/day: 0.50  . Smokeless tobacco: Never Used  Substance Use Topics  . Alcohol use: Not Currently    Alcohol/week: 18.0 - 20.0 standard drinks    Types: 8 - 10 Cans of beer, 10 Shots of liquor per week  . Drug use: Not Currently    Types: Marijuana, Cocaine, Benzodiazepines    Review of Systems Constitutional: Well appearing. Respiratory: Negative for dyspnea. Cardiovascular: Negative for change in skin temperature or color. Musculoskeletal:   Negative for chronic steroid use   Negative for trauma in the presence of osteoporosis  Negative for age over 62 and trauma.  Negative for constitutional symptoms, or history of cancer   Negative for pain worse at night. Skin: Negative for rash, lesion, or wound.  Genitourinary: Negative for urinary retention. Rectal: Negative for fecal incontinence or new onset constipation/bowel habit changes. Hematological/Immunilogical: Negative for immunosuppression, IV drug use, or fever Neurological: Negative for burning, tingling, numb, electric, radiating pain in the extremities.                        Negative for saddle anesthesia.  Negative for focal neurologic deficit, progressive or disabling symptoms             Negative for saddle anesthesia. ____________________________________________   PHYSICAL EXAM:  VITAL SIGNS: ED Triage Vitals  Enc Vitals Group     BP 07/29/18 0832 (!) 153/100     Pulse Rate 07/29/18 0832 89     Resp 07/29/18 0832 16     Temp 07/29/18 0832 97.9 F (36.6 C)     Temp Source 07/29/18 0832 Oral     SpO2 07/29/18 0832 97 %     Weight 07/29/18 0833 150 lb (68 kg)     Height  07/29/18 0833 5\' 4"  (1.626 m)     Head Circumference --      Peak Flow --      Pain Score 07/29/18 0833 8     Pain Loc --      Pain Edu? --      Excl. in GC? --     Constitutional: Alert and oriented. Well appearing and in no acute distress. Eyes: Conjunctivae are clear without discharge or drainage.  Head: Atraumatic. Neck: Full, active range of motion. Respiratory: Respirations even and unlabored. Musculoskeletal: Decreased ROM of the back and extremities secondary to pain, Strength 5/5 of the lower extremities as tested. Neurologic: Reflexes of the lower extremities are 2+.  Negative straight leg raise on the right and left side. Skin: Atraumatic.  Psychiatric: Behavior and affect are normal.  ____________________________________________   LABS (all labs ordered are listed, but only abnormal results are displayed)  Labs Reviewed  URINALYSIS, COMPLETE (UACMP) WITH MICROSCOPIC - Abnormal; Notable for the following components:      Result Value   Color, Urine YELLOW (*)    APPearance HAZY (*)    Protein, ur 30 (*)    All other components within normal limits   ____________________________________________  RADIOLOGY  Not indicated ____________________________________________   PROCEDURES  Procedure(s) performed:  Procedures ____________________________________________   INITIAL IMPRESSION / ASSESSMENT AND PLAN / ED COURSE  Beverly Terrell is a 38 y.o. female who presents to the emergency department for treatment of right-sided low back pain that has been present since yesterday.  Area of pain is the right flank.  Will obtain urine.  She is also complaining of some nausea.  Zofran ordered.  ----------------------------------------- 10:58 AM on 07/29/2018 -----------------------------------------  Significant relief of pain after Toradol and Norflex.  Urinalysis is reassuring.  She will be given prescriptions for Toradol and Zanaflex.  She was instructed to rest and use  heat or ice, whichever provides the most relief for her.  She was instructed to follow-up with orthopedics if her symptoms are not improving over the next week or so.  She is to return to the emergency department for symptoms of change or worsen if unable to schedule an appointment.  Medications  orphenadrine (NORFLEX) injection 60 mg (60 mg Intramuscular Given 07/29/18 1026)  ondansetron (ZOFRAN-ODT) disintegrating tablet 4 mg (4 mg Oral Given 07/29/18 0957)  ketorolac (TORADOL) 30 MG/ML injection 30 mg (30 mg Intramuscular Given 07/29/18 1027)    ED Discharge Orders         Ordered    ketorolac (TORADOL) 10 MG tablet  Every 6 hours PRN    Note to Pharmacy:  Received injection in ER   07/29/18 1100    tiZANidine (ZANAFLEX) 4 MG capsule  3 times daily PRN     07/29/18 1100  Pertinent labs & imaging results that were available during my care of the patient were reviewed by me and considered in my medical decision making (see chart for details).  _________________________________________   FINAL CLINICAL IMPRESSION(S) / ED DIAGNOSES  Final diagnoses:  Acute right-sided low back pain without sciatica     If controlled substance prescribed during this visit, 12 month history viewed on the [facility] prior to issuing an initial prescription for Schedule II or III opiod.   Chinita Pesterriplett, Nochum Fenter B, FNP 07/29/18 1101    Willy Eddyobinson, Patrick, MD 07/29/18 1329

## 2018-11-12 DIAGNOSIS — J029 Acute pharyngitis, unspecified: Secondary | ICD-10-CM | POA: Diagnosis not present

## 2018-12-03 ENCOUNTER — Ambulatory Visit: Payer: BLUE CROSS/BLUE SHIELD | Admitting: Obstetrics and Gynecology

## 2018-12-09 ENCOUNTER — Telehealth: Payer: Self-pay | Admitting: Obstetrics and Gynecology

## 2018-12-09 ENCOUNTER — Other Ambulatory Visit (HOSPITAL_COMMUNITY)
Admission: RE | Admit: 2018-12-09 | Discharge: 2018-12-09 | Disposition: A | Discharge status: Home / Self Care | Payer: No Typology Code available for payment source | Source: Ambulatory Visit | Attending: Obstetrics and Gynecology | Admitting: Obstetrics and Gynecology

## 2018-12-09 ENCOUNTER — Other Ambulatory Visit: Payer: Self-pay

## 2018-12-09 ENCOUNTER — Encounter: Payer: Self-pay | Admitting: Obstetrics and Gynecology

## 2018-12-09 ENCOUNTER — Ambulatory Visit (INDEPENDENT_AMBULATORY_CARE_PROVIDER_SITE_OTHER): Payer: No Typology Code available for payment source | Admitting: Obstetrics and Gynecology

## 2018-12-09 VITALS — BP 110/70 | Ht 64.0 in | Wt 157.0 lb

## 2018-12-09 DIAGNOSIS — N6320 Unspecified lump in the left breast, unspecified quadrant: Secondary | ICD-10-CM | POA: Diagnosis not present

## 2018-12-09 DIAGNOSIS — N926 Irregular menstruation, unspecified: Secondary | ICD-10-CM

## 2018-12-09 DIAGNOSIS — Z3169 Encounter for other general counseling and advice on procreation: Secondary | ICD-10-CM

## 2018-12-09 DIAGNOSIS — Z113 Encounter for screening for infections with a predominantly sexual mode of transmission: Secondary | ICD-10-CM | POA: Insufficient documentation

## 2018-12-09 NOTE — Patient Instructions (Signed)
I value your feedback and entrusting us with your care. If you get a [facility] patient survey, I would appreciate you taking the time to let us know about your experience today. Thank you! 

## 2018-12-09 NOTE — Telephone Encounter (Signed)
Spoke with patient she is aware of her appt time and date at [facility] on Wed December 16, 2018 @ 10:40am.

## 2018-12-09 NOTE — Progress Notes (Signed)
Beverly Ruths, MD   Chief Complaint  Patient presents with  . Metrorrhagia    one time, no pain, pt thinks when she came off anti-depressant it messed with her cycle  . STD check    HPI:      [name] is a 38 y.o. G0P0000 who LMP was Patient's last menstrual period was 12/03/2018 (exact date)., presents today for 8 days-late menses 9/20 with some BTB beforehand. Oct menses normal. Weaned off SSRI 9/20 and thinks might be cause of irreg menses. Menses are usually every 28-30 days, lasting 4-5 days.  Dysmenorrhea mild, occurring first 1-2 days of flow, no BTB.  She has a female partner and has been trying to conceive with donor sperm since 8/20. She is concerned about conception. Takes PNVs occas.  Would like full STD testing. No hx of STDs.  Also has noticed a LT breast mass for the past 6 months at area of incision where had breast reduction 2018. Had post surg complication of "hole" in incision site where suture was. Mass is in same spot. Not tender, no change in size since first noticed. No FH breast/ovar cancer. No prior imaging.  Past Medical History:  Diagnosis Date  . BV (bacterial vaginosis)   . Depression   . Liver disease     Past Surgical History:  Procedure Laterality Date  . BREAST REDUCTION SURGERY  12/2016    Family History  Problem Relation Age of Onset  . Renal Disease Maternal Grandmother   . Uterine cancer Maternal Grandmother   . Heart failure Maternal Grandmother   . Diabetes Maternal Grandfather   . Bladder Cancer Maternal Grandfather   . Alcohol abuse Paternal Grandfather     Social History   Socioeconomic History  . Marital status: Single    Spouse name: Not on file  . Number of children: Not on file  . Years of education: Not on file  . Highest education level: Not on file  Occupational History  . Not on file  Social Needs  . Financial resource strain: Not on file  . Food insecurity    Worry: Not on file    Inability: Not  on file  . Transportation needs    Medical: Not on file    Non-medical: Not on file  Tobacco Use  . Smoking status: Former Smoker    Packs/day: 0.50  . Smokeless tobacco: Never Used  Substance and Sexual Activity  . Alcohol use: Not Currently    Alcohol/week: 18.0 - 20.0 standard drinks    Types: 8 - 10 Cans of beer, 10 Shots of liquor per week  . Drug use: Not Currently    Types: Marijuana, Cocaine, Benzodiazepines  . Sexual activity: Yes    Birth control/protection: None  Lifestyle  . Physical activity    Days per week: Not on file    Minutes per session: Not on file  . Stress: Not on file  Relationships  . Social Herbalist on phone: Not on file    Gets together: Not on file    Attends religious service: Not on file    Active member of club or organization: Not on file    Attends meetings of clubs or organizations: Not on file    Relationship status: Not on file  . Intimate partner violence    Fear of current or ex partner: Not on file    Emotionally abused: Not on file    Physically  abused: Not on file    Forced sexual activity: Not on file  Other Topics Concern  . Not on file  Social History Narrative  . Not on file    Outpatient Medications Prior to Visit  Medication Sig Dispense Refill  . FAMOTIDINE PO Take by mouth.    . escitalopram (LEXAPRO) 10 MG tablet Take 10 mg by mouth daily.    Marland Kitchen ketorolac (TORADOL) 10 MG tablet Take 1 tablet (10 mg total) by mouth every 6 (six) hours as needed. 20 tablet 0  . ranitidine (ZANTAC) 150 MG tablet Take by mouth.    Marland Kitchen tiZANidine (ZANAFLEX) 4 MG capsule Take 1 capsule (4 mg total) by mouth 3 (three) times daily as needed for muscle spasms. 30 capsule 0   No facility-administered medications prior to visit.       ROS:  Review of Systems  Constitutional: Negative for fatigue, fever and unexpected weight change.  Respiratory: Negative for cough, shortness of breath and wheezing.   Cardiovascular: Negative for  chest pain, palpitations and leg swelling.  Gastrointestinal: Negative for blood in stool, constipation, diarrhea, nausea and vomiting.  Endocrine: Negative for cold intolerance, heat intolerance and polyuria.  Genitourinary: Positive for menstrual problem. Negative for dyspareunia, dysuria, flank pain, frequency, genital sores, hematuria, pelvic pain, urgency, vaginal bleeding, vaginal discharge and vaginal pain.  Musculoskeletal: Negative for back pain, joint swelling and myalgias.  Skin: Negative for rash.  Neurological: Negative for dizziness, syncope, light-headedness, numbness and headaches.  Hematological: Negative for adenopathy.  Psychiatric/Behavioral: Negative for agitation, confusion, sleep disturbance and suicidal ideas. The patient is not nervous/anxious.    BREAST: mass   OBJECTIVE:   Vitals:  BP 110/70   Ht 5\' 4"  (1.626 m)   Wt 157 lb (71.2 kg)   LMP 12/03/2018 (Exact Date)   BMI 26.95 kg/m   Physical Exam Vitals signs reviewed.  Constitutional:      Appearance: She is well-developed.  Neck:     Musculoskeletal: Normal range of motion.  Pulmonary:     Effort: Pulmonary effort is normal.  Chest:     Breasts: Breasts are symmetrical.        Right: No inverted nipple, mass, nipple discharge, skin change or tenderness.        Left: No inverted nipple, mass, nipple discharge, skin change or tenderness.    Genitourinary:    General: Normal vulva.     Pubic Area: No rash.      Labia:        Right: No rash, tenderness or lesion.        Left: No rash, tenderness or lesion.      Vagina: Normal. No vaginal discharge, erythema or tenderness.     Cervix: Normal.     Uterus: Normal. Not enlarged and not tender.      Adnexa: Right adnexa normal and left adnexa normal.       Right: No mass or tenderness.         Left: No mass or tenderness.    Musculoskeletal: Normal range of motion.  Skin:    General: Skin is warm and dry.  Neurological:     General: No focal  deficit present.     Mental Status: She is alert and oriented to person, place, and time.     Cranial Nerves: No cranial nerve deficit.  Psychiatric:        Mood and Affect: Mood normal.        Behavior: Behavior normal.  Thought Content: Thought content normal.        Judgment: Judgment normal.     Assessment/Plan: Late menses--1 cycle only with normal subsequent cycle. Follow expectantly. Trying to conceive since 8/20 with donor sperm. Discussed ovulation, pred kits. F/u if no conception in 6 months. Cont PNVs.  Pre-conception counseling  Breast mass, left - Plan: MM DIAG BREAST TOMO BILATERAL, US BREAST LTD UNI RIGHT INC AXILLA, US BREAST LTD UNI LEFT INC AXILLA; 7:00 position at incision site. Question scar tissue vs new mass. Check dx mammo/u/s. Will f/u with results.   Screening for STD (sexually transmitted disease) - Plan: HIV Antibody (routine testing w rflx), RPR, Hepatitis C antibody, HSV 2 antibody, IgG, Cervicovaginal ancillary only     Return if symptoms worsen or fail to improve.  Bayler Gehrig B. Zadok Holaway, PA-C 12/09/2018 11:02 AM

## 2018-12-11 LAB — HEPATITIS C ANTIBODY: Hep C Virus Ab: <0.1 s/co ratio (ref 0.0–0.9)

## 2018-12-11 LAB — HSV 2 ANTIBODY, IGG: HSV 2 IgG, Type Spec: <0.91 index (ref 0.00–0.90)

## 2018-12-11 LAB — HIV ANTIBODY (ROUTINE TESTING W REFLEX): HIV Screen 4th Generation wRfx: NONREACTIVE

## 2018-12-11 LAB — RPR: RPR Ser Ql: NONREACTIVE

## 2018-12-16 ENCOUNTER — Other Ambulatory Visit: Payer: BLUE CROSS/BLUE SHIELD

## 2018-12-17 LAB — CERVICOVAGINAL ANCILLARY ONLY
Chlamydia: NEGATIVE
Comment: NEGATIVE
Comment: NEGATIVE
Comment: NORMAL
Neisseria Gonorrhea: NEGATIVE
Trichomonas: NEGATIVE

## 2018-12-22 ENCOUNTER — Ambulatory Visit
Admission: RE | Admit: 2018-12-22 | Discharge: 2018-12-22 | Disposition: A | Discharge status: Home / Self Care | Payer: No Typology Code available for payment source | Source: Ambulatory Visit | Attending: Obstetrics and Gynecology | Admitting: Obstetrics and Gynecology

## 2018-12-22 DIAGNOSIS — N6320 Unspecified lump in the left breast, unspecified quadrant: Secondary | ICD-10-CM | POA: Insufficient documentation

## 2019-11-13 IMAGING — US US BREAST*L* LIMITED INC AXILLA
1 series · 5 of 5 positions shown · non-contrast
Comparison: None.

CLINICAL DATA: 38-year-old patient with superficial palpable lump
inframammary fold medial left breast. History of breast reduction
approximately 2 years ago. She has palpated a lump since the
reduction, and it is stable in size. No known family history of
breast cancer.

EXAM:
DIGITAL DIAGNOSTIC BILATERAL MAMMOGRAM WITH CAD AND TOMO
ULTRASOUND LEFT BREAST

[Series 1: us breast*left* limited inc axilla · 0.07mm/px · 5 of 5 slices shown]
[im 1/5]
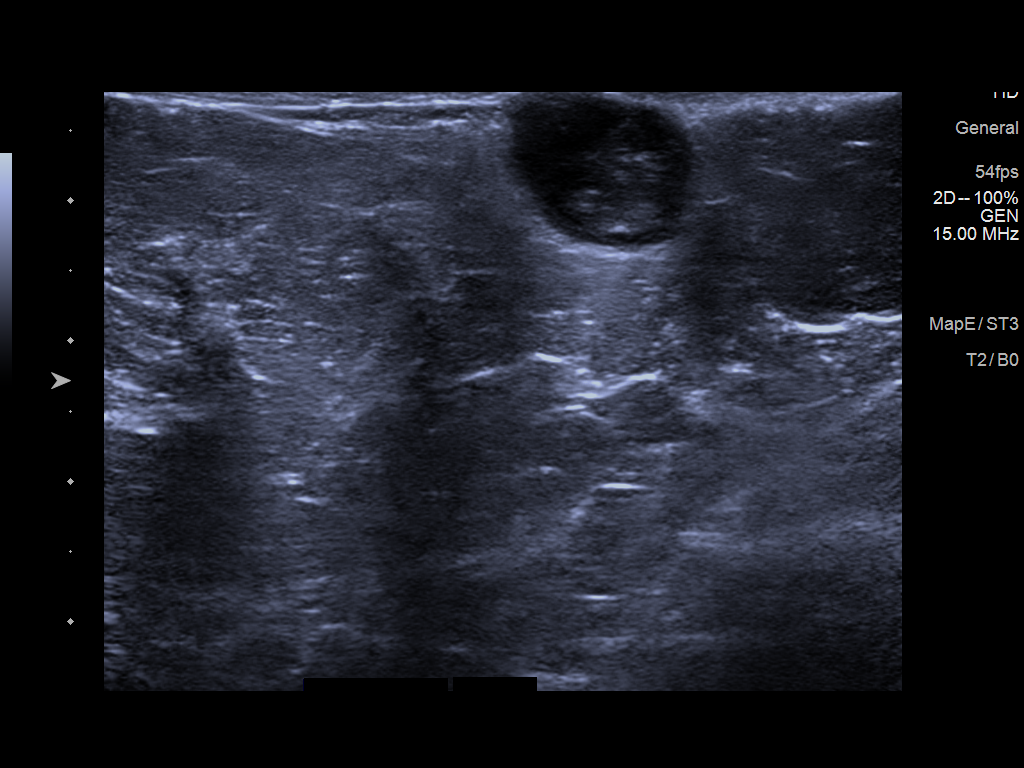
[im 2/5]
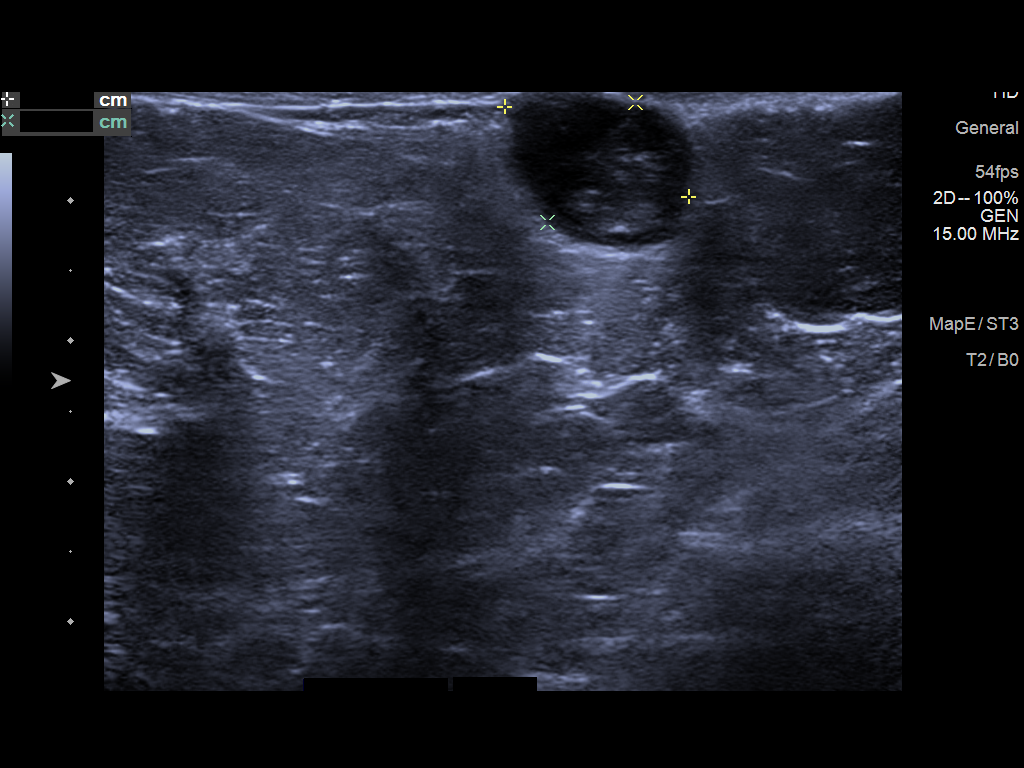
[im 3/5]
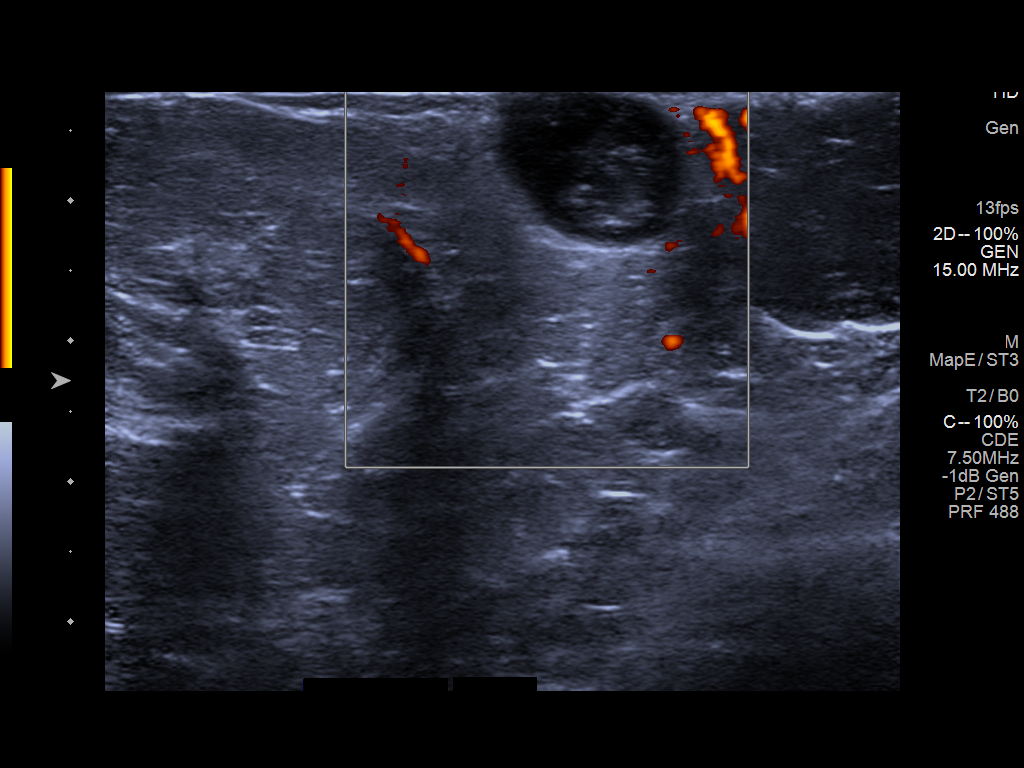
[im 4/5]
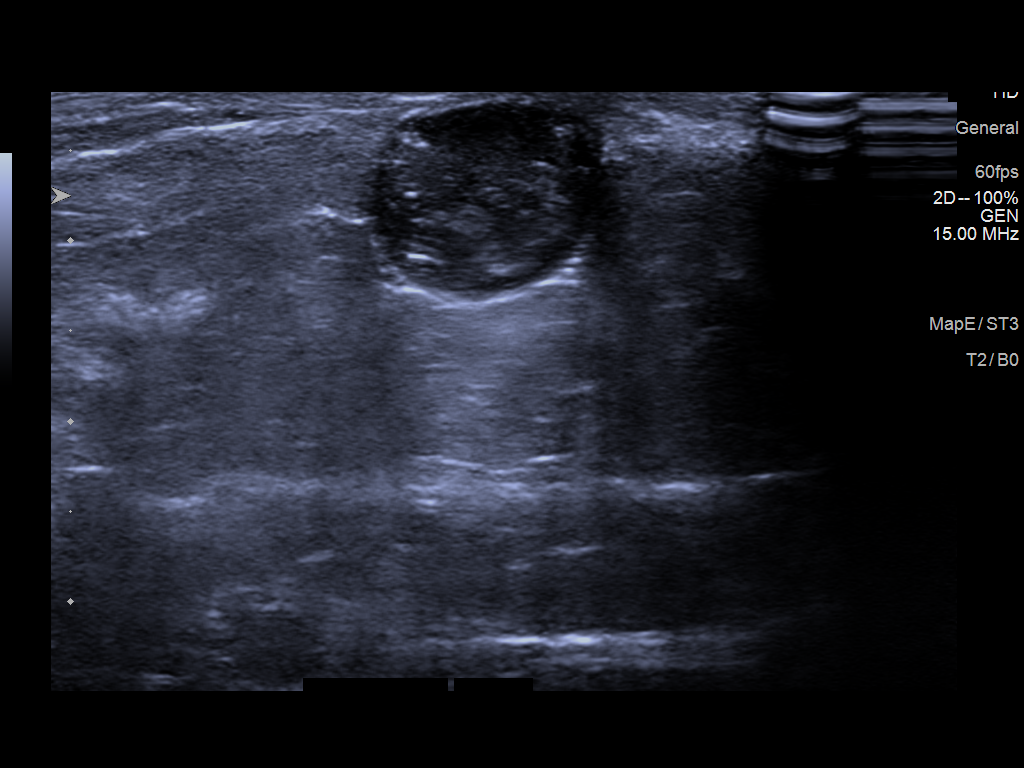
[im 5/5]
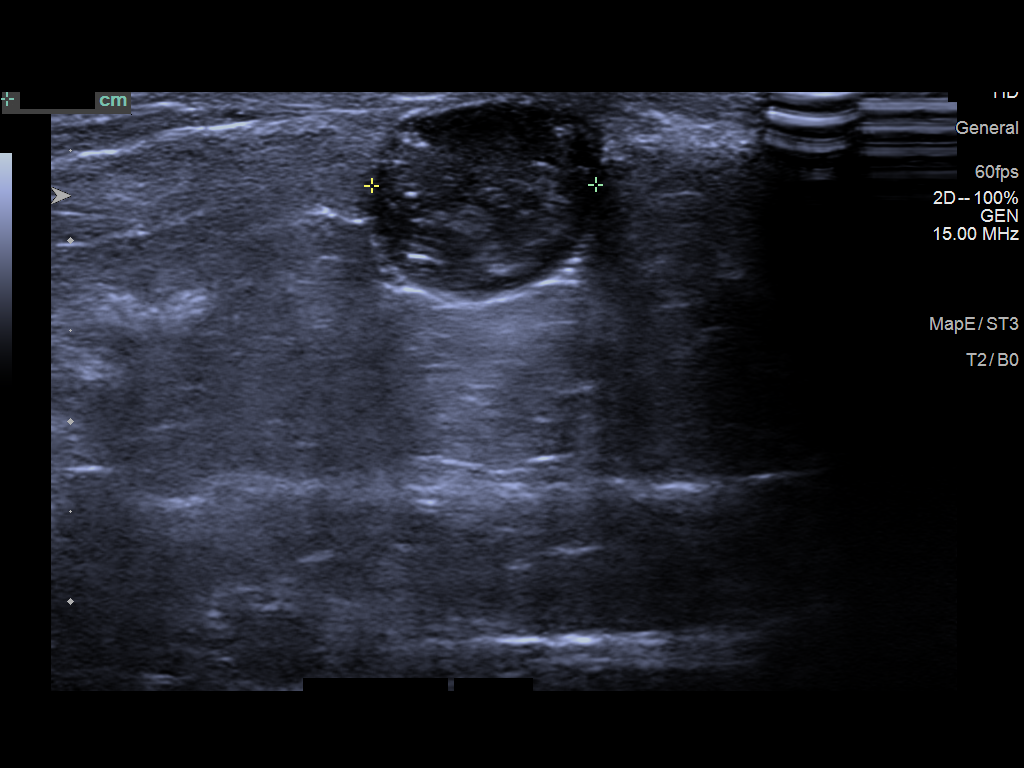

[5 of 5 positions shown; findings below may reference images not displayed]

Baseline mammogram.

ACR Breast Density Category b: There are scattered areas of
fibroglandular density.
FINDINGS: There are bilateral breast reduction changes. A circumscribed oval
mass in the superficial aspect of the breast near the inframammary
fold medially is the site of palpable concern as marked by a skin
BB. This mass is approximately 1.3 cm and is contiguous with the
skin.

Mammographic images were processed with CAD.

On physical exam, there is a smooth superficial approximately 1 cm
lump along the inframammary fold near the surgical scar.

Targeted ultrasound is performed, showing a circumscribed oval cyst
with internal echogenic debris at [DATE] position left breast. This
mass measures 1.5 x 1.1 x 1.2 cm. The superficial aspect of the mass
is contiguous with the inferior aspect of the dermis and the
majority of the mass is within the immediate subdermal breast fat.
There is no associated vascular flow.
IMPRESSION: Benign superficial palpable mass left breast has appearances most
consistent with a benign dermal inclusion cyst or benign fat
necrosis.

Bilateral breast reduction changes. No evidence of malignancy in
either breast.

RECOMMENDATION:
Screening mammogram at age 40 unless there are persistent or
intervening clinical concerns. (Code:SX-Z-IE8)

I have discussed the findings and recommendations with the patient.
If applicable, a reminder letter will be sent to the patient
regarding the next appointment.

BI-RADS CATEGORY  2: Benign.

## 2021-07-08 NOTE — Progress Notes (Signed)
? ?PCP:  Lauro RegulusAnderson, Marshall W, MD ? ? ?Chief Complaint  ?Patient presents with  ? Gynecologic Exam  ?  Severe cramping during cycle x years  ? ? ? ?HPI: ?     [name] is a 41 y.o. G0P0000 who LMP was Patient's last menstrual period was 07/05/2021 (exact date)., presents today for her annual examination.  Her menses are regular every 28-30 days, lasting 5 days, mod flow.  Dysmenorrhea mod usually, improved somewhat with NSIADs; but gets severe Q3 months, NSAIDs/heating pad not helpful and pt has to miss work. Flow is heavier with clots these months as well. No non-menstrual bleeding. ? ?Sex activity: has sex with female/wife. No pain/bleeding.  ?Last Pap: 10/20/17  Results were: no abnormalities  ?Hx of STDs: none ? ?Mammogram: 12/22/18 Results were normal after addl views LT breast, repeat in 12 months ?There is no FH of breast cancer. There is no FH of ovarian cancer. The patient does not do self-breast exams. ? ?Tobacco use: The patient denies current or previous tobacco use. ?Alcohol use: social ?No drug use.  ?Exercise: min active ? ?She does get adequate calcium but not Vitamin D in her diet. ?No recent fasting labs.  ? ? ?Past Medical History:  ?Diagnosis Date  ? BV (bacterial vaginosis)   ? Depression   ? Liver disease   ? ? ?Past Surgical History:  ?Procedure Laterality Date  ? BREAST REDUCTION SURGERY  12/2016  ? REDUCTION MAMMAPLASTY    ? ? ?Family History  ?Problem Relation Age of Onset  ? Renal Disease Maternal Grandmother   ? Uterine cancer Maternal Grandmother   ? Heart failure Maternal Grandmother   ? Diabetes Maternal Grandfather   ? Bladder Cancer Maternal Grandfather   ? Alcohol abuse Paternal Grandfather   ? ? ?Social History  ? ?Socioeconomic History  ? Marital status: Married  ?  Spouse name: Not on file  ? Number of children: Not on file  ? Years of education: Not on file  ? Highest education level: Not on file  ?Occupational History  ? Not on file  ?Tobacco Use  ? Smoking status: Former   ?  Packs/day: 0.50  ?  Types: Cigarettes  ? Smokeless tobacco: Never  ?Vaping Use  ? Vaping Use: Former  ?Substance and Sexual Activity  ? Alcohol use: Not Currently  ?  Alcohol/week: 18.0 - 20.0 standard drinks  ?  Types: 8 - 10 Cans of beer, 10 Shots of liquor per week  ? Drug use: Not Currently  ?  Types: Marijuana, Cocaine, Benzodiazepines  ? Sexual activity: Yes  ?  Birth control/protection: None  ?Other Topics Concern  ? Not on file  ?Social History Narrative  ? Not on file  ? ?Social Determinants of Health  ? ?Financial Resource Strain: Not on file  ?Food Insecurity: Not on file  ?Transportation Needs: Not on file  ?Physical Activity: Not on file  ?Stress: Not on file  ?Social Connections: Not on file  ?Intimate Partner Violence: Not on file  ? ? ?Outpatient Medications Prior to Visit  ?Medication Sig Dispense Refill  ? ketoconazole (NIZORAL) 2 % shampoo PLEASE SEE ATTACHED FOR DETAILED DIRECTIONS    ? omeprazole (PRILOSEC) 20 MG capsule Take by mouth.    ? VITAMIN D PO Take by mouth. 5,000 MCG    ? FAMOTIDINE PO Take by mouth.    ? ?No facility-administered medications prior to visit.  ? ? ? ? ?ROS: ? ?Review of Systems  ?  Constitutional:  Negative for fatigue, fever and unexpected weight change.  ?Respiratory:  Negative for cough, shortness of breath and wheezing.   ?Cardiovascular:  Negative for chest pain, palpitations and leg swelling.  ?Gastrointestinal:  Negative for blood in stool, constipation, diarrhea, nausea and vomiting.  ?Endocrine: Negative for cold intolerance, heat intolerance and polyuria.  ?Genitourinary:  Negative for dyspareunia, dysuria, flank pain, frequency, genital sores, hematuria, menstrual problem, pelvic pain, urgency, vaginal bleeding, vaginal discharge and vaginal pain.  ?Musculoskeletal:  Negative for back pain, joint swelling and myalgias.  ?Skin:  Negative for rash.  ?Neurological:  Negative for dizziness, syncope, light-headedness, numbness and headaches.  ?Hematological:   Negative for adenopathy.  ?Psychiatric/Behavioral:  Negative for agitation, confusion, sleep disturbance and suicidal ideas. The patient is not nervous/anxious.   ?BREAST: No symptoms ? ? ?Objective: ?BP 118/74   Ht 5\' 4"  (1.626 m)   Wt 157 lb (71.2 kg)   LMP 07/05/2021 (Exact Date)   BMI 26.95 kg/m?  ? ? ?Physical Exam ?Constitutional:   ?   Appearance: She is well-developed.  ?Genitourinary:  ?   Vulva normal.  ?   Right Labia: No rash, tenderness or lesions. ?   Left Labia: No tenderness, lesions or rash. ?   No vaginal discharge, erythema or tenderness.  ? ?   Right Adnexa: not tender and no mass present. ?   Left Adnexa: not tender and no mass present. ?   No cervical motion tenderness, friability or polyp.  ?   Uterus is not enlarged or tender.  ?Breasts: ?   Right: No mass, nipple discharge, skin change or tenderness.  ?   Left: No mass, nipple discharge, skin change or tenderness.  ?Neck:  ?   Thyroid: No thyromegaly.  ?Cardiovascular:  ?   Rate and Rhythm: Normal rate and regular rhythm.  ?   Heart sounds: Normal heart sounds. No murmur heard. ?Pulmonary:  ?   Effort: Pulmonary effort is normal.  ?   Breath sounds: Normal breath sounds.  ?Abdominal:  ?   Palpations: Abdomen is soft.  ?   Tenderness: There is no abdominal tenderness. There is no guarding or rebound.  ?Musculoskeletal:     ?   General: Normal range of motion.  ?   Cervical back: Normal range of motion.  ?Lymphadenopathy:  ?   Cervical: No cervical adenopathy.  ?Neurological:  ?   General: No focal deficit present.  ?   Mental Status: She is alert and oriented to person, place, and time.  ?   Cranial Nerves: No cranial nerve deficit.  ?Skin: ?   General: Skin is warm and dry.  ?Psychiatric:     ?   Mood and Affect: Mood normal.     ?   Behavior: Behavior normal.     ?   Thought Content: Thought content normal.     ?   Judgment: Judgment normal.  ?Vitals reviewed.  ? ? ?Assessment/Plan: ?Encounter for annual routine gynecological  examination ? ?Cervical cancer screening - Plan: Cytology - PAP ? ?Screening for HPV (human papillomavirus) - Plan: Cytology - PAP ? ?Encounter for screening mammogram for malignant neoplasm of breast - Plan: MM 3D SCREEN BREAST BILATERAL; pt to schedule mammo ? ?Dysmenorrhea - Plan: 09/04/2021 PELVIS TRANSVAGINAL NON-OB (TV ONLY); worsening sx Q3 months; check GYN u/s. If neg, can try Rx NSAIDs vs Rx muscle relaxer to help with sx vs hormones. Will f/u with results.  ? ?Blood tests for routine general physical  examination - Plan: Comprehensive metabolic panel, Lipid panel ? ?Screening cholesterol level - Plan: Lipid panel ? ?         ?GYN counsel adequate intake of calcium and vitamin D, diet and exercise ? ? ?  F/U ? Return in about 2 weeks (around 07/24/2021) for [facility] GYN u/s for dysmen--[facility] to call pt. /1 yr annual ? ?Beverly Sobieski B. Fani Rotondo, PA-C ?07/10/2021 ?10:22 AM ?

## 2021-07-10 ENCOUNTER — Encounter: Payer: Self-pay | Admitting: Obstetrics and Gynecology

## 2021-07-10 ENCOUNTER — Other Ambulatory Visit (HOSPITAL_COMMUNITY)
Admission: RE | Admit: 2021-07-10 | Discharge: 2021-07-10 | Disposition: A | Discharge status: Home / Self Care | Payer: PRIVATE HEALTH INSURANCE | Source: Ambulatory Visit | Attending: Obstetrics and Gynecology | Admitting: Obstetrics and Gynecology

## 2021-07-10 ENCOUNTER — Ambulatory Visit (INDEPENDENT_AMBULATORY_CARE_PROVIDER_SITE_OTHER): Payer: No Typology Code available for payment source | Admitting: Obstetrics and Gynecology

## 2021-07-10 VITALS — BP 118/74 | Ht 64.0 in | Wt 157.0 lb

## 2021-07-10 DIAGNOSIS — Z1151 Encounter for screening for human papillomavirus (HPV): Secondary | ICD-10-CM | POA: Diagnosis present

## 2021-07-10 DIAGNOSIS — Z01419 Encounter for gynecological examination (general) (routine) without abnormal findings: Secondary | ICD-10-CM

## 2021-07-10 DIAGNOSIS — Z124 Encounter for screening for malignant neoplasm of cervix: Secondary | ICD-10-CM | POA: Diagnosis present

## 2021-07-10 DIAGNOSIS — Z1231 Encounter for screening mammogram for malignant neoplasm of breast: Secondary | ICD-10-CM

## 2021-07-10 DIAGNOSIS — Z13220 Encounter for screening for lipoid disorders: Secondary | ICD-10-CM

## 2021-07-10 DIAGNOSIS — Z0000 Encounter for general adult medical examination without abnormal findings: Secondary | ICD-10-CM

## 2021-07-10 DIAGNOSIS — N946 Dysmenorrhea, unspecified: Secondary | ICD-10-CM

## 2021-07-10 NOTE — Patient Instructions (Signed)
I value your feedback and you entrusting us with your care. If you get a [facility] patient survey, I would appreciate you taking the time to let us know about your experience today. Thank you!  [facility]: [phone]      

## 2021-07-13 LAB — CYTOLOGY - PAP
Comment: NEGATIVE
Diagnosis: NEGATIVE
Diagnosis: REACTIVE
High risk HPV: NEGATIVE

## 2021-07-26 ENCOUNTER — Ambulatory Visit (INDEPENDENT_AMBULATORY_CARE_PROVIDER_SITE_OTHER): Payer: No Typology Code available for payment source

## 2021-07-26 ENCOUNTER — Other Ambulatory Visit: Payer: Self-pay | Admitting: Obstetrics and Gynecology

## 2021-07-26 ENCOUNTER — Other Ambulatory Visit: Payer: No Typology Code available for payment source

## 2021-07-26 DIAGNOSIS — N946 Dysmenorrhea, unspecified: Secondary | ICD-10-CM | POA: Diagnosis not present

## 2021-07-26 DIAGNOSIS — Z13220 Encounter for screening for lipoid disorders: Secondary | ICD-10-CM

## 2021-07-26 DIAGNOSIS — Z0000 Encounter for general adult medical examination without abnormal findings: Secondary | ICD-10-CM

## 2021-07-27 LAB — LIPID PANEL
Chol/HDL Ratio: 3.2 ratio (ref 0.0–4.4)
Cholesterol, Total: 216 mg/dL — ABNORMAL HIGH (ref 100–199)
HDL: 68 mg/dL (ref >39)
LDL Chol Calc (NIH): 113 mg/dL — ABNORMAL HIGH (ref 0–99)
Triglycerides: 206 mg/dL — ABNORMAL HIGH (ref 0–149)
VLDL Cholesterol Cal: 35 mg/dL (ref 5–40)

## 2021-07-27 LAB — COMPREHENSIVE METABOLIC PANEL
ALT: 12 IU/L (ref 0–32)
AST: 16 IU/L (ref 0–40)
Albumin/Globulin Ratio: 2.1 (ref 1.2–2.2)
Albumin: 4.6 g/dL (ref 3.8–4.8)
Alkaline Phosphatase: 68 IU/L (ref 44–121)
BUN/Creatinine Ratio: 17 (ref 9–23)
BUN: 11 mg/dL (ref 6–24)
Bilirubin Total: 0.5 mg/dL (ref 0.0–1.2)
CO2: 23 mmol/L (ref 20–29)
Calcium: 9.1 mg/dL (ref 8.7–10.2)
Chloride: 101 mmol/L (ref 96–106)
Creatinine, Ser: 0.63 mg/dL (ref 0.57–1.00)
Globulin, Total: 2.2 g/dL (ref 1.5–4.5)
Glucose: 84 mg/dL (ref 70–99)
Potassium: 4.3 mmol/L (ref 3.5–5.2)
Sodium: 138 mmol/L (ref 134–144)
Total Protein: 6.8 g/dL (ref 6.0–8.5)
eGFR: 115 mL/min/{1.73_m2} (ref >59)

## 2021-07-30 ENCOUNTER — Encounter: Payer: Self-pay | Admitting: Obstetrics and Gynecology

## 2021-08-09 ENCOUNTER — Ambulatory Visit
Admission: RE | Admit: 2021-08-09 | Discharge: 2021-08-09 | Disposition: A | Discharge status: Home / Self Care | Payer: No Typology Code available for payment source | Source: Ambulatory Visit | Attending: Obstetrics and Gynecology | Admitting: Obstetrics and Gynecology

## 2021-08-09 DIAGNOSIS — Z1231 Encounter for screening mammogram for malignant neoplasm of breast: Secondary | ICD-10-CM | POA: Insufficient documentation

## 2021-08-14 ENCOUNTER — Other Ambulatory Visit: Payer: Self-pay | Admitting: Obstetrics and Gynecology

## 2021-08-14 MED ORDER — NAPROXEN SODIUM 550 MG PO TABS: 550.0000 mg | ORAL_TABLET | Freq: Two times a day (BID) | ORAL | 1 refills | Status: AC

## 2021-08-14 NOTE — Telephone Encounter (Signed)
Rx anaprox eRxd. Pls notify pt (system error won't let me send pt msg). Thx.

## 2021-08-14 NOTE — Progress Notes (Signed)
Rx anaprox for dysmen with neg GYN u/s.

## 2021-08-15 NOTE — Telephone Encounter (Signed)
Pt aware.

## 2022-07-01 IMAGING — MG MM DIGITAL SCREENING BILAT W/ TOMO AND CAD
8 series · 8 of 24 positions shown · non-contrast
Comparison: Previous exam(s).

CLINICAL DATA: Screening.

EXAM:
DIGITAL SCREENING BILATERAL MAMMOGRAM WITH TOMOSYNTHESIS AND CAD
TECHNIQUE: Bilateral screening digital craniocaudal and mediolateral oblique
mammograms were obtained. Bilateral screening digital breast
tomosynthesis was performed. The images were evaluated with
computer-aided detection.

[R MLO synth-2D]
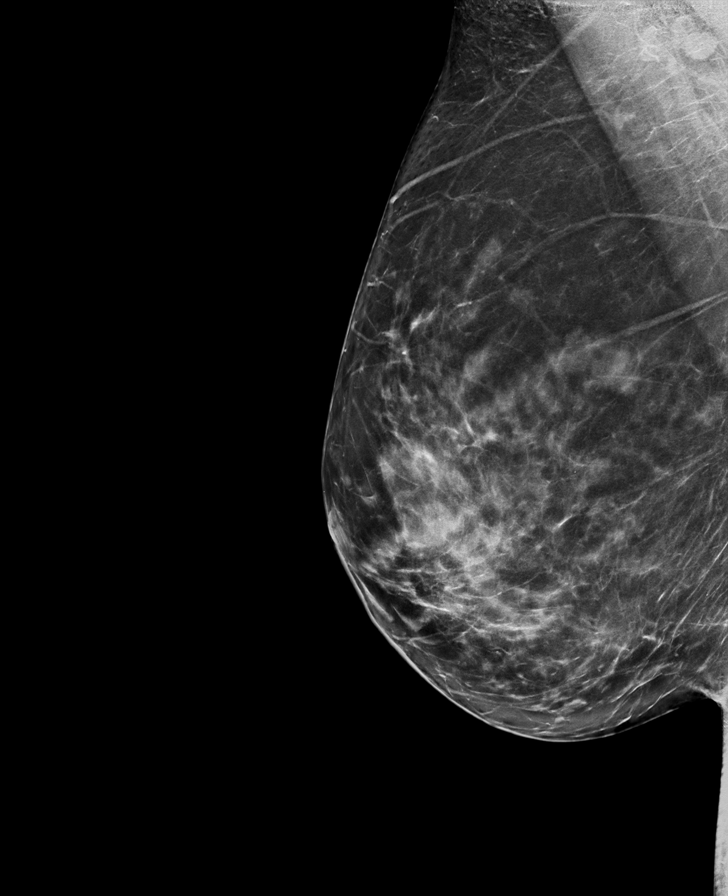

[R CC synth-2D]
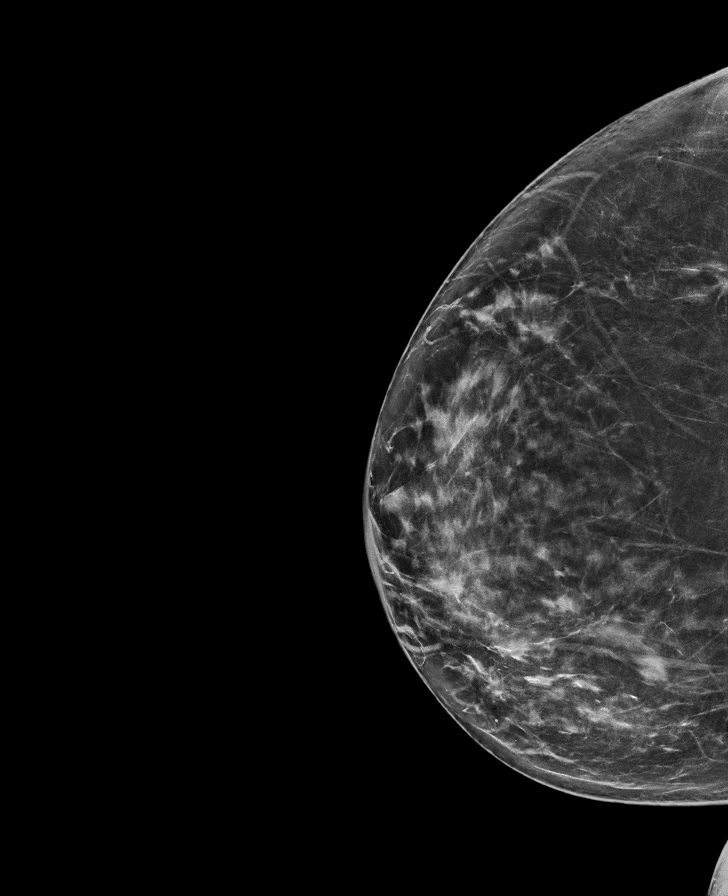

[L CC synth-2D]
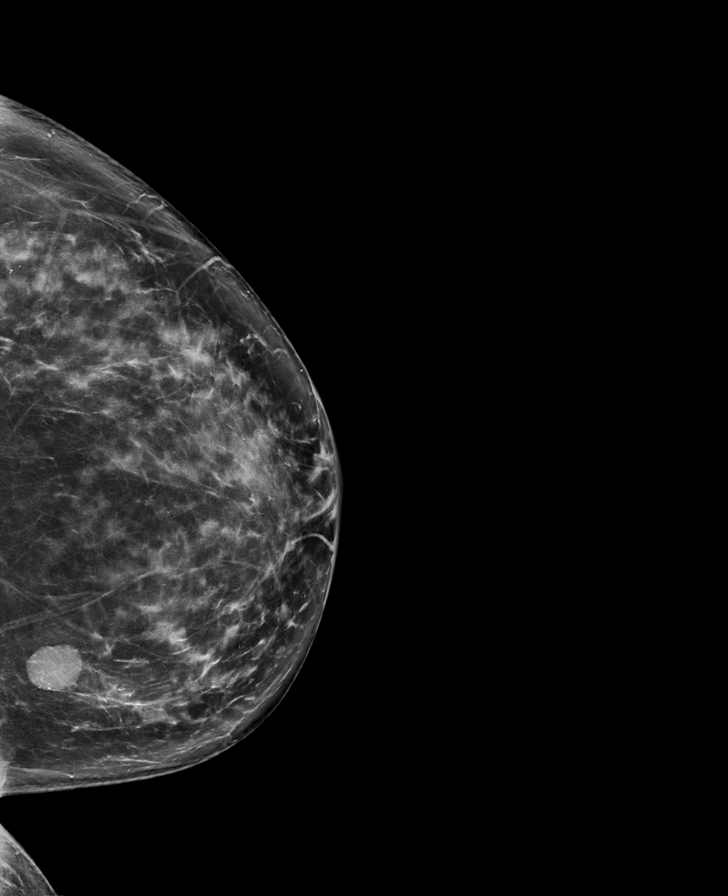

[L MLO synth-2D]
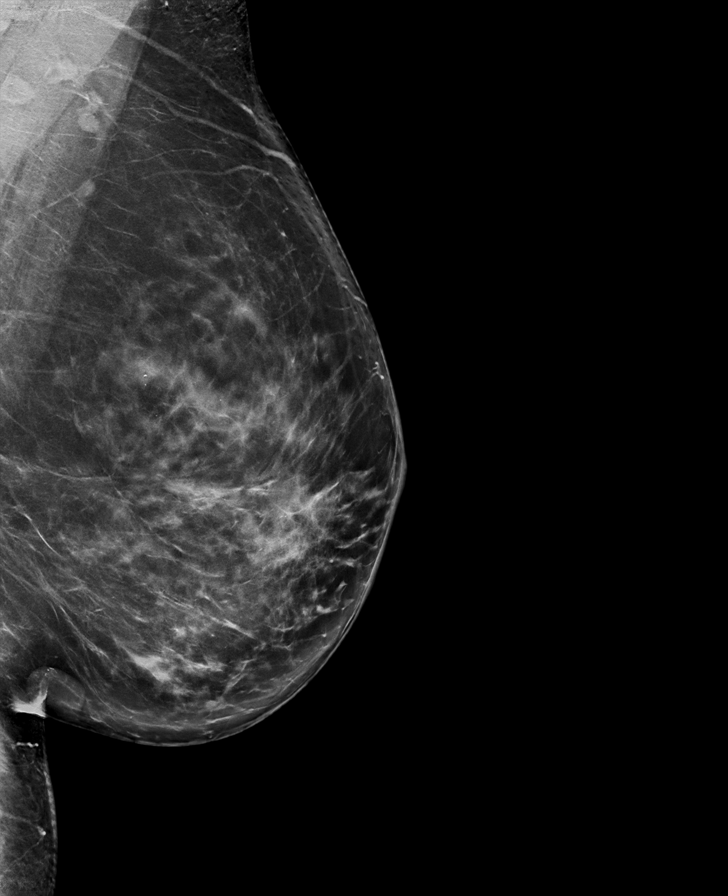

[R CC tomo · tomo slice 40/79.0]
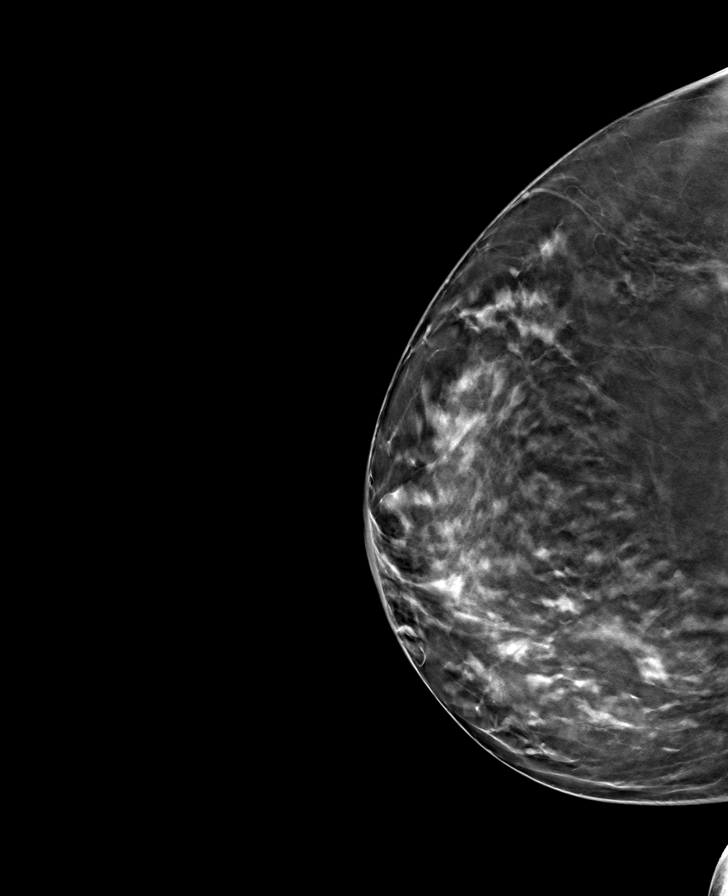

[L CC tomo · tomo slice 43/84.0]
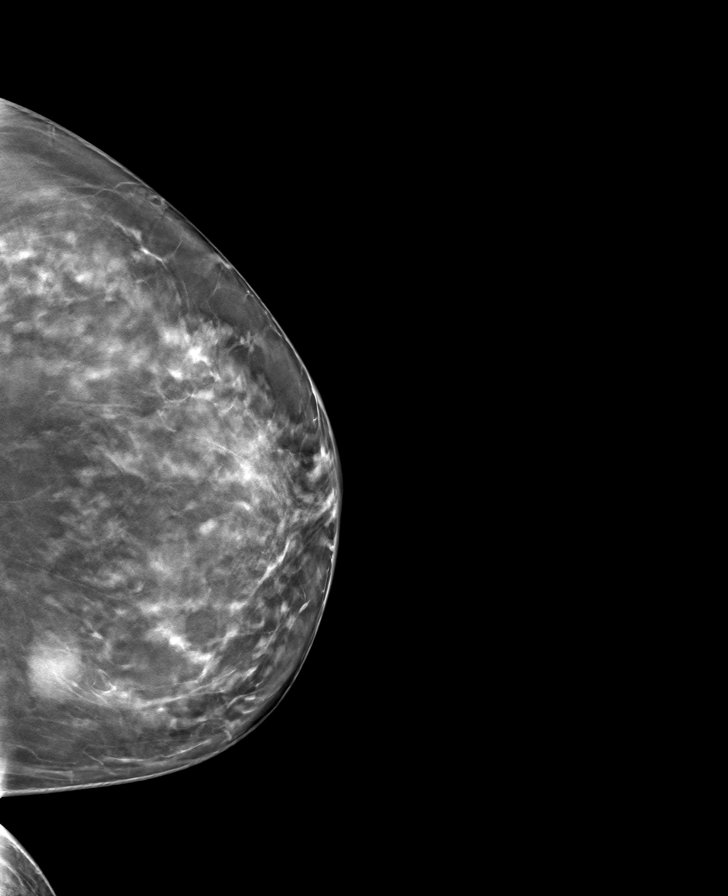

[R MLO tomo · tomo slice 42/83.0]
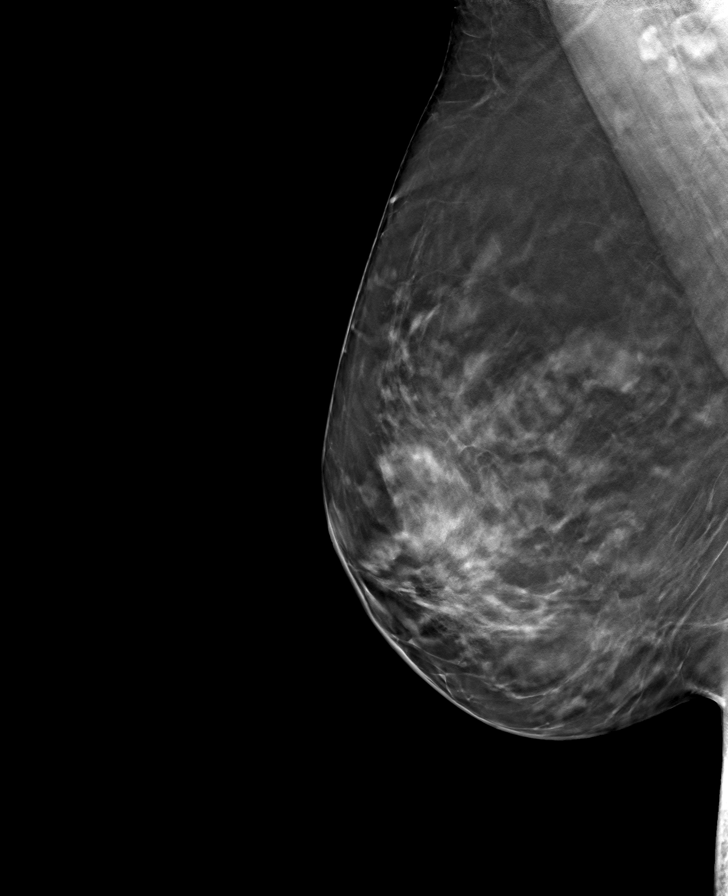

[L MLO tomo · tomo slice 45/88.0]
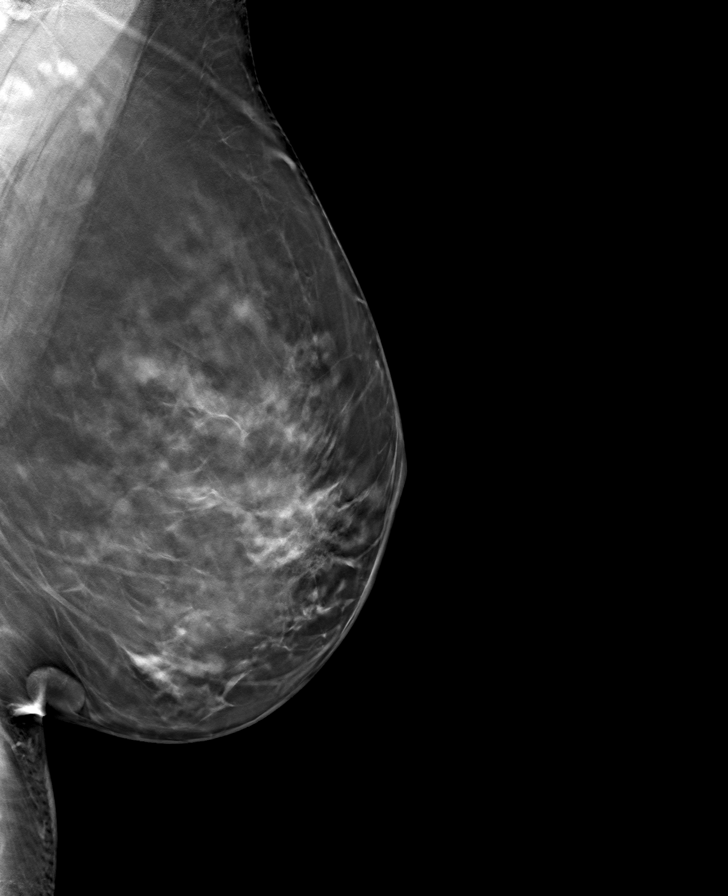

[8 of 24 positions shown; findings below may reference images not displayed]

ACR Breast Density Category c: The breast tissue is heterogeneously
dense, which may obscure small masses.
FINDINGS: There are no findings suspicious for malignancy.
IMPRESSION: No mammographic evidence of malignancy. A result letter of this
screening mammogram will be mailed directly to the patient.

RECOMMENDATION:
Screening mammogram in one year. (Code:Q3-W-BC3)

BI-RADS CATEGORY  1: Negative.

## 2023-02-21 ENCOUNTER — Ambulatory Visit
Admission: RE | Admit: 2023-02-21 | Discharge: 2023-02-21 | Disposition: A | Discharge status: Home / Self Care | Payer: No Typology Code available for payment source | Source: Ambulatory Visit | Attending: Emergency Medicine | Admitting: Emergency Medicine

## 2023-02-21 VITALS — BP 132/89 | HR 68 | Temp 99.2°F | Resp 16

## 2023-02-21 DIAGNOSIS — T148XXA Other injury of unspecified body region, initial encounter: Secondary | ICD-10-CM

## 2023-02-21 MED ORDER — DOXYCYCLINE HYCLATE 100 MG PO CAPS: 100.0000 mg | ORAL_CAPSULE | Freq: Two times a day (BID) | ORAL | 0 refills | Status: DC

## 2023-02-21 NOTE — ED Triage Notes (Signed)
Pt presents with an abscess in her right groin area. Pt states 4 days ago she noticed a hole, but no drainage is coming out.

## 2023-02-21 NOTE — Discharge Instructions (Signed)
Today you were evaluated for the area to your groin which is not consistent with a cyst however as it is open wound we will provide bacterial coverage  Take doxycycline every morning and every evening for 7 days to clear any germs contributing to your symptoms  Continue to cleanse at least once daily with soap and water and can continue to cover with a nonstick Band-Aid  You may hold warm compresses over the affected area as needed for comfort  May take Tylenol or Motrin as needed for comfort  If you have any concerns regarding healing you are welcome to return to clinic for reevaluation

## 2023-02-21 NOTE — ED Provider Notes (Signed)
Renaldo Fiddler    CSN: [id number] Arrival date & time: 02/21/23  1535      History   Chief Complaint Chief Complaint  Patient presents with   Abscess    Open area to right groin - Entered by patient    HPI Beverly Terrell is a 42 y.o. female.   Patient presents for evaluation of a possible abscess to the right groin beginning 4 days ago.  Endorses has occurred before she ingrown hairs but this time has presented differently.  Noticed a hole to the center but no drainage.  Has been cleansing daily and applying topical antibiotic ointment.  Denies fever.  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 Authorizing Provider  doxycycline (VIBRAMYCIN) 100 MG capsule Take 1 capsule (100 mg total) by mouth 2 (two) times daily. 02/21/23  Yes Devora Tortorella R, NP  ketoconazole (NIZORAL) 2 % shampoo PLEASE SEE ATTACHED FOR DETAILED DIRECTIONS 01/18/21   [provider]  naproxen sodium (ANAPROX DS) 550 MG tablet Take 1 tablet (550 mg total) by mouth 2 (two) times daily with a meal. 08/14/21    Copland, Alicia B, PA-C  omeprazole (PRILOSEC) 20 MG capsule Take by mouth.    [provider]  VITAMIN D PO Take by mouth. 5,000 MCG    [provider]    Family History Family History  Problem Relation Age of Onset   Renal Disease Maternal Grandmother    Uterine cancer Maternal Grandmother    Heart failure Maternal Grandmother    Diabetes Maternal Grandfather    Bladder Cancer Maternal Grandfather    Alcohol abuse Paternal Grandfather     Social History Social History   Tobacco Use   Smoking status: Former    Current packs/day: 0.50    Types: Cigarettes   Smokeless tobacco: Never  Vaping Use   Vaping status: Former  Substance Use Topics   Alcohol use: Not Currently    Alcohol/week: 18.0 - 20.0 standard drinks of alcohol    Types: 8 - 10 Cans of beer, 10 Shots of liquor per week   Drug use: Not Currently    Types: Marijuana, Cocaine, Benzodiazepines     Allergies   Tizanidine hcl   Review of Systems Review of Systems   Physical Exam Triage Vital Signs ED Triage Vitals  Encounter Vitals Group  BP 02/21/23 1606 132/89     Systolic BP Percentile --      Diastolic BP Percentile --      Pulse Rate 02/21/23 1606 68     Resp 02/21/23 1606 16     Temp 02/21/23 1606 99.2 F (37.3 C)     Temp Source 02/21/23 1606 Oral     SpO2 02/21/23 1606 98 %     Weight --      Height --      Head Circumference --      Peak Flow --      Pain Score 02/21/23 1607 0     Pain Loc --      Pain Education --      Exclude from Growth Chart --    No data found.  Updated Vital Signs BP 132/89 (BP Location: Left Arm)   Pulse 68   Temp 99.2 F (37.3 C) (Oral)   Resp 16   LMP 01/22/2023   SpO2 98%   Visual Acuity Right Eye Distance:   Left Eye Distance:   Bilateral Distance:    Right Eye Near:   Left Eye Near:    Bilateral Near:     Physical Exam Constitutional:      Appearance: Normal appearance.  Eyes:     Extraocular Movements: Extraocular  movements intact.  Pulmonary:     Effort: Pulmonary effort is normal.  Skin:    Comments: Ecchymosis with deep purple color present to the center of the right groin, with 0.5 cm opening to the center, mildly tender to palpation, no drainage noted, granulation tissue within the wound bed, no drainage present  Neurological:     Mental Status: She is alert and oriented to person, place, and time. Mental status is at baseline.      UC Treatments / Results  Labs (all labs ordered are listed, but only abnormal results are displayed) Labs Reviewed - No data to display  EKG   Radiology No results found.  Procedures Procedures (including critical care time)  Medications Ordered in UC Medications - No data to display  Initial Impression / Assessment and Plan / UC Course  I have reviewed the triage vital signs and the nursing notes.  Pertinent labs & imaging results that were available during my care of the patient were reviewed by me and considered in my medical decision making (see chart for details).  Open wound  Presentation is not consistent with send, no drainage noted on exam however as there is an open wound from unknown etiology will provide bacterial coverage, discussed this with patient, doxycycline prescribed and advised to monitor, advised to continue daily cleansing and to follow-up for any concerns regarding healing Final Clinical Impressions(s) / UC Diagnoses   Final diagnoses:  Open wound     Discharge Instructions      Today you were evaluated for the area to your groin which is not consistent with a cyst however as it is open wound we will provide bacterial coverage  Take doxycycline every morning and every evening for 7 days to clear any germs contributing to your symptoms  Continue to cleanse at least once daily with soap and water and can continue to cover with a nonstick Band-Aid  You may hold warm compresses over the affected area as needed for  comfort  May take Tylenol or Motrin as needed for comfort  If you have any concerns regarding healing you are welcome to return to clinic for reevaluation  ED Prescriptions     Medication Sig Dispense Auth. Provider   doxycycline (VIBRAMYCIN) 100 MG capsule Take 1 capsule (100 mg total) by mouth 2 (two) times daily. 14 capsule Jaelene Garciagarcia, Elita Boone, NP      PDMP not reviewed this encounter.   Valinda Hoar, NP 02/21/23 [phone]

## 2023-06-27 ENCOUNTER — Other Ambulatory Visit: Payer: Self-pay | Admitting: Internal Medicine

## 2023-06-27 DIAGNOSIS — Z1231 Encounter for screening mammogram for malignant neoplasm of breast: Secondary | ICD-10-CM

## 2023-07-07 ENCOUNTER — Ambulatory Visit
Admission: RE | Admit: 2023-07-07 | Discharge: 2023-07-07 | Disposition: A | Discharge status: Home / Self Care | Source: Ambulatory Visit

## 2023-07-07 VITALS — BP 121/87 | HR 84 | Temp 98.4°F | Resp 18

## 2023-07-07 DIAGNOSIS — J069 Acute upper respiratory infection, unspecified: Secondary | ICD-10-CM

## 2023-07-07 MED ORDER — BENZONATATE 100 MG PO CAPS: 100.0000 mg | ORAL_CAPSULE | Freq: Three times a day (TID) | ORAL | 0 refills | Status: DC

## 2023-07-07 MED ORDER — PREDNISONE 10 MG (21) PO TBPK: ORAL_TABLET | Freq: Every day | ORAL | 0 refills | Status: DC

## 2023-07-07 MED ORDER — AMOXICILLIN-POT CLAVULANATE 875-125 MG PO TABS: 1.0000 | ORAL_TABLET | Freq: Two times a day (BID) | ORAL | 0 refills | Status: DC

## 2023-07-07 MED ORDER — PROMETHAZINE-DM 6.25-15 MG/5ML PO SYRP: 5.0000 mL | ORAL_SOLUTION | Freq: Every evening | ORAL | 0 refills | Status: DC | PRN

## 2023-07-07 NOTE — ED Triage Notes (Signed)
 Patient presents to [facility] for "deep cough and nasal congestion" x 4 days. Treating with OTC cough med.  Denies fever.

## 2023-07-07 NOTE — ED Provider Notes (Signed)
 Beverly Terrell    CSN: [id number] Arrival date & time: 07/07/23  1514      History   Chief Complaint Chief Complaint  Patient presents with   Cough   Nasal Congestion    HPI Beverly Terrell is a 43 y.o. female.   Patient presents for evaluation of right-sided ear popping, nasal congestion, chest congestion, productive cough with yellow sputum and shortness of breath with exertion present for 4 days.  Denies fever, sore throat, wheezing or abdominal symptoms.  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  Tolerating food and liquids.  Has attempted use of over-the-counter cold and flu medicines and cough suppressants.  Denies respiratory history, former smoker.  COVID test x 2 negative.  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 Authorizing Provider  amoxicillin-clavulanate (AUGMENTIN) 875-125 MG tablet Take 1 tablet by mouth every 12 (twelve) hours. 07/12/23  Yes Zaydn Gutridge R, NP  amphetamine-dextroamphetamine (ADDERALL) 10 MG tablet Take 10 mg by mouth 2 (two) times daily. 05/19/23   Yes [provider]  benzonatate (TESSALON) 100 MG capsule Take 1 capsule (100 mg total) by mouth every 8 (eight) hours. 07/07/23  Yes Kamalani Mastro, Nerissa Bannister R, NP  buPROPion ER (WELLBUTRIN SR) 100 MG 12 hr tablet Take by mouth. 01/29/23  Yes [provider]  predniSONE (STERAPRED UNI-PAK 21 TAB) 10 MG (21) TBPK tablet Take by mouth daily. Take 6 tabs by mouth daily  for 1 days, then 5 tabs for 1 days, then 4 tabs for 1 days, then 3 tabs for 1 days, 2 tabs for 1 days, then 1 tab by mouth daily for 1 days 07/07/23  Yes Kanin Lia R, NP  promethazine-dextromethorphan (PROMETHAZINE-DM) 6.25-15 MG/5ML syrup Take 5 mLs by mouth at bedtime as needed. 07/07/23  Yes Yukari Flax R, NP  sertraline (ZOLOFT) 50 MG tablet Take 75 mg by mouth daily. 05/13/23  Yes [provider]  doxycycline  (VIBRAMYCIN ) 100 MG capsule Take 1 capsule (100 mg total) by mouth 2 (two) times daily. 02/21/23   Nahsir Venezia, Maybelle Spatz, NP  ketoconazole (NIZORAL) 2 % shampoo PLEASE SEE ATTACHED FOR DETAILED DIRECTIONS 01/18/21   [provider]  naproxen  sodium (ANAPROX  DS) 550 MG tablet Take 1 tablet (550 mg total) by mouth 2 (two) times daily with a meal. 08/14/21   Copland, Amada Jun,  PA-C  omeprazole (PRILOSEC) 20 MG capsule Take by mouth.    [provider]  propranolol (INDERAL) 20 MG tablet Take 20 mg by mouth.    [provider]  VITAMIN D PO Take by mouth. 5,000 MCG    [provider]    Family History Family History  Problem Relation Age of Onset   Renal Disease Maternal Grandmother    Uterine cancer Maternal Grandmother    Heart failure Maternal Grandmother    Diabetes Maternal Grandfather    Bladder Cancer Maternal Grandfather    Alcohol abuse Paternal Grandfather     Social History Social History   Tobacco Use   Smoking status: Former    Current packs/day: 0.50    Types: Cigarettes   Smokeless tobacco: Never  Vaping Use   Vaping status: Former  Substance  Use Topics   Alcohol use: Not Currently    Alcohol/week: 18.0 - 20.0 standard drinks of alcohol    Types: 8 - 10 Cans of beer, 10 Shots of liquor per week   Drug use: Not Currently    Types: Marijuana, Cocaine, Benzodiazepines     Allergies   Tizanidine  hcl   Review of Systems Review of Systems   Physical Exam Triage Vital Signs ED Triage Vitals  Encounter Vitals Group     BP 07/07/23 1522 121/87     Systolic BP Percentile --      Diastolic BP Percentile --      Pulse Rate 07/07/23 1522 84     Resp 07/07/23 1522 18     Temp 07/07/23 1522 98.4 F (36.9 C)     Temp Source 07/07/23 1522 Temporal     SpO2 07/07/23 1522 98 %     Weight --      Height --      Head Circumference --      Peak Flow --      Pain Score 07/07/23 1520 0     Pain Loc --      Pain Education --      Exclude from Growth Chart --    No data found.  Updated Vital Signs BP 121/87 (BP Location: Right Arm)   Pulse 84   Temp 98.4 F (36.9 C) (Temporal)   Resp 18   LMP 07/04/2023 (Approximate)   SpO2 98%   Visual Acuity Right Eye Distance:   Left Eye Distance:   Bilateral Distance:    Right Eye Near:   Left Eye Near:    Bilateral Near:     Physical Exam Constitutional:      Appearance: Normal appearance.  HENT:     Head: Normocephalic.     Right Ear: Tympanic membrane, ear canal and external ear normal.     Left Ear: Tympanic membrane, ear canal and external ear normal.     Nose: Congestion present.     Mouth/Throat:     Pharynx: Posterior oropharyngeal erythema present. No oropharyngeal exudate.  Eyes:     Extraocular Movements: Extraocular movements intact.  Cardiovascular:     Rate and Rhythm: Normal rate and regular rhythm.     Pulses: Normal pulses.     Heart sounds: Normal heart sounds.  Pulmonary:     Effort: Pulmonary effort is normal.     Breath sounds: Normal breath sounds.  Neurological:     Mental Status: She is alert and oriented to person, place, and time. Mental  status is at baseline.      UC Treatments /  Results  Labs (all labs ordered are listed, but only abnormal results are displayed) Labs Reviewed - No data to display  EKG   Radiology No results found.  Procedures Procedures (including critical care time)  Medications Ordered in UC Medications - No data to display  Initial Impression / Assessment and Plan / UC Course  I have reviewed the triage vital signs and the nursing notes.  Pertinent labs & imaging results that were available during my care of the patient were reviewed by me and considered in my medical decision making (see chart for details).  Viral URI with cough  Patient is in no signs of distress nor toxic appearing.  Vital signs are stable.  Low suspicion for pneumonia, pneumothorax or bronchitis and therefore will defer imaging.  Etiology most likely viral, prescribed prednisone, Tessalon Promethazine DM, watch wait antibiotic placed at pharmacy if no improvement seen.May use additional over-the-counter medications as needed for supportive care.  May follow-up with urgent care as needed if symptoms persist or worsen.  Note given.   Final Clinical Impressions(s) / UC Diagnoses   Final diagnoses:  Viral URI with cough     Discharge Instructions      Your symptoms today are most likely being caused by a virus and should steadily improve in time it can take up to 7 to 10 days before you truly start to see a turnaround however things will get better, if no improvement seen by Saturday may pick up Augmentin from the pharmacy  In the meantime begin prednisone every morning with food to open and relax airway, should help with shortness of breath  You may use Tessalon pills every 8 hours as needed for cough, may use cough syrup at bedtime for comfort    You can take Tylenol and/or Ibuprofen as needed for fever reduction and pain relief.   For cough: honey 1/2 to 1 teaspoon (you can dilute the honey in water or  another fluid).  You can also use guaifenesin and dextromethorphan for cough. You can use a humidifier for chest congestion and cough.  If you don't have a humidifier, you can sit in the bathroom with the hot shower running.      For sore throat: try warm salt water gargles, cepacol lozenges, throat spray, warm tea or water with lemon/honey, popsicles or ice, or OTC cold relief medicine for throat discomfort.   For congestion: take a daily anti-histamine like Zyrtec, Claritin, and a oral decongestant, such as pseudoephedrine.  You can also use Flonase 1-2 sprays in each nostril daily.   It is important to stay hydrated: drink plenty of fluids (water, gatorade/powerade/pedialyte, juices, or teas) to keep your throat moisturized and help further relieve irritation/discomfort.    ED Prescriptions     Medication Sig Dispense Auth. Provider   predniSONE (STERAPRED UNI-PAK 21 TAB) 10 MG (21) TBPK tablet Take by mouth daily. Take 6 tabs by mouth daily  for 1 days, then 5 tabs for 1 days, then 4 tabs for 1 days, then 3 tabs for 1 days, 2 tabs for 1 days, then 1 tab by mouth daily for 1 days 21 tablet Shade Kaley R, NP   benzonatate (TESSALON) 100 MG capsule Take 1 capsule (100 mg total) by mouth every 8 (eight) hours. 21 capsule Adrienna Karis R, NP   promethazine-dextromethorphan (PROMETHAZINE-DM) 6.25-15 MG/5ML syrup Take 5 mLs by mouth at bedtime as needed. 118 mL Mahamud Metts R, NP   amoxicillin-clavulanate (AUGMENTIN) 875-125 MG tablet Take 1  tablet by mouth every 12 (twelve) hours. 14 tablet Vy Badley R, NP      PDMP not reviewed this encounter.   Reena Canning, NP 07/07/23 [phone]

## 2023-07-07 NOTE — Discharge Instructions (Signed)
 Your symptoms today are most likely being caused by a virus and should steadily improve in time it can take up to 7 to 10 days before you truly start to see a turnaround however things will get better, if no improvement seen by Saturday may pick up Augmentin from the pharmacy  In the meantime begin prednisone every morning with food to open and relax airway, should help with shortness of breath  You may use Tessalon pills every 8 hours as needed for cough, may use cough syrup at bedtime for comfort    You can take Tylenol and/or Ibuprofen as needed for fever reduction and pain relief.   For cough: honey 1/2 to 1 teaspoon (you can dilute the honey in water or another fluid).  You can also use guaifenesin and dextromethorphan for cough. You can use a humidifier for chest congestion and cough.  If you don't have a humidifier, you can sit in the bathroom with the hot shower running.      For sore throat: try warm salt water gargles, cepacol lozenges, throat spray, warm tea or water with lemon/honey, popsicles or ice, or OTC cold relief medicine for throat discomfort.   For congestion: take a daily anti-histamine like Zyrtec, Claritin, and a oral decongestant, such as pseudoephedrine.  You can also use Flonase 1-2 sprays in each nostril daily.   It is important to stay hydrated: drink plenty of fluids (water, gatorade/powerade/pedialyte, juices, or teas) to keep your throat moisturized and help further relieve irritation/discomfort.

## 2023-07-07 NOTE — ED Notes (Signed)
Negative at home COVID test

## 2023-07-10 ENCOUNTER — Encounter (HOSPITAL_COMMUNITY): Payer: Self-pay

## 2023-07-17 ENCOUNTER — Ambulatory Visit
Admission: RE | Admit: 2023-07-17 | Discharge: 2023-07-17 | Disposition: A | Discharge status: Home / Self Care | Source: Ambulatory Visit | Attending: Internal Medicine | Admitting: Internal Medicine

## 2023-07-17 DIAGNOSIS — Z1231 Encounter for screening mammogram for malignant neoplasm of breast: Secondary | ICD-10-CM | POA: Insufficient documentation

## 2023-11-13 ENCOUNTER — Encounter: Payer: Self-pay | Admitting: Family Medicine

## 2023-11-13 ENCOUNTER — Ambulatory Visit (INDEPENDENT_AMBULATORY_CARE_PROVIDER_SITE_OTHER): Admitting: Family Medicine

## 2023-11-13 VITALS — BP 107/73 | HR 73 | Temp 98.2°F | Ht 63.0 in | Wt 142.2 lb

## 2023-11-13 DIAGNOSIS — E663 Overweight: Secondary | ICD-10-CM | POA: Insufficient documentation

## 2023-11-13 DIAGNOSIS — K709 Alcoholic liver disease, unspecified: Secondary | ICD-10-CM | POA: Insufficient documentation

## 2023-11-13 DIAGNOSIS — F4310 Post-traumatic stress disorder, unspecified: Secondary | ICD-10-CM

## 2023-11-13 DIAGNOSIS — K219 Gastro-esophageal reflux disease without esophagitis: Secondary | ICD-10-CM | POA: Diagnosis not present

## 2023-11-13 DIAGNOSIS — F1990 Other psychoactive substance use, unspecified, uncomplicated: Secondary | ICD-10-CM | POA: Insufficient documentation

## 2023-11-13 DIAGNOSIS — Z7689 Persons encountering health services in other specified circumstances: Secondary | ICD-10-CM

## 2023-11-13 DIAGNOSIS — F411 Generalized anxiety disorder: Secondary | ICD-10-CM | POA: Diagnosis not present

## 2023-11-13 DIAGNOSIS — Z85828 Personal history of other malignant neoplasm of skin: Secondary | ICD-10-CM | POA: Insufficient documentation

## 2023-11-13 DIAGNOSIS — F324 Major depressive disorder, single episode, in partial remission: Secondary | ICD-10-CM | POA: Insufficient documentation

## 2023-11-13 DIAGNOSIS — N946 Dysmenorrhea, unspecified: Secondary | ICD-10-CM

## 2023-11-13 DIAGNOSIS — F1010 Alcohol abuse, uncomplicated: Secondary | ICD-10-CM | POA: Insufficient documentation

## 2023-11-13 DIAGNOSIS — L732 Hidradenitis suppurativa: Secondary | ICD-10-CM | POA: Insufficient documentation

## 2023-11-13 MED ORDER — PANTOPRAZOLE SODIUM 40 MG PO TBEC: 40.0000 mg | DELAYED_RELEASE_TABLET | Freq: Every day | ORAL | 1 refills | Status: DC

## 2023-11-13 NOTE — Progress Notes (Signed)
 New Patient Office Visit  Introduced to nurse practitioner role and practice setting.  All questions answered.  Discussed provider/patient relationship and expectations.  Subjective    Patient ID: Beverly Terrell, female    DOB: November 20, 1980  Age: 43 y.o. MRN: [id number]  CC:  Chief Complaint  Patient presents with   New Patient (Initial Visit)    -Patient is here to establish care with a new provider due to insurance.   -Reports that she does have bad heart burn and takes an acid reducer.    HPI  Discussed the use of AI scribe software for clinical note transcription with the patient, who gave verbal consent to proceed.  History of Present Illness Beverly Terrell is a 43 year old female who presents for a routine follow-up and medication management.  She has a history of depression, which worsened after the COVID-19 pandemic. She engaged in therapy for a year and was prescribed medications, including Wellbutrin and Zoloft, which she takes daily. She also takes propranolol for anxiety and migraines.  She has a history of liver disease attributed to heavy alcohol use in her twenties, which led to hospitalization and elevated liver enzymes. Her last hepatic panel in January 2025 was normal. She currently consumes alcohol daily, drinking 3-4 IPAs per day, but feels she has a good grasp on her alcohol use and does not use it as a coping mechanism.  She experiences gastric reflux and takes esomeprazole daily, which she has been using for over three years. She reports nausea and poor appetite. She has lost 20 pounds recently, going from 165 pounds to 145 pounds.  Her past medical history includes polysubstance use in 2007, breast reduction surgery, and a history of endometrial polyp identified during a transvaginal ultrasound. She experiences severe menstrual cramps that occur about three to four times a year, causing significant discomfort.  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 She has two dogs and two cats. She reports feeling safe at home and in her relationship.  Dermatology - HS - [facility]  - have removed cancerous cell    Outpatient Encounter Medications as of 11/13/2023  Medication Sig   buPROPion ER (WELLBUTRIN SR) 100 MG 12 hr tablet Take by mouth.   naproxen  sodium (ANAPROX  DS) 550 MG tablet Take 1 tablet (550 mg total) by mouth 2 (two) times daily with a meal. (Patient taking differently: Take 550 mg by mouth as needed for moderate pain (pain score 4-6) or headache.)   pantoprazole  (PROTONIX ) 40 MG tablet Take 1 tablet (40 mg total) by mouth daily.   propranolol ER (INDERAL LA) 60 MG 24 hr capsule Take 60 mg by mouth daily.   sertraline (ZOLOFT) 50 MG tablet Take 75 mg by mouth daily.   VITAMIN D PO Take by mouth. 5,000 MCG   [DISCONTINUED] esomeprazole (NEXIUM) 20 MG capsule Take 20 mg by mouth daily at 12 noon.   [DISCONTINUED] ketoconazole (NIZORAL) 2 % shampoo PLEASE SEE ATTACHED FOR DETAILED DIRECTIONS   [DISCONTINUED] omeprazole (PRILOSEC) 20 MG capsule Take by mouth.   [DISCONTINUED] propranolol (INDERAL) 20 MG tablet Take 20 mg by mouth. (Patient taking differently: Take 60 mg by mouth.)   [DISCONTINUED] amoxicillin -clavulanate (AUGMENTIN ) 875-125 MG tablet Take 1 tablet by mouth every 12 (twelve) hours.   [DISCONTINUED] amphetamine-dextroamphetamine (ADDERALL) 10 MG tablet Take 10 mg by mouth 2 (two) times daily.   [DISCONTINUED] benzonatate  (TESSALON ) 100 MG capsule  Take 1 capsule (100 mg total) by mouth every 8 (eight) hours.   [DISCONTINUED] doxycycline  (VIBRAMYCIN ) 100 MG capsule Take 1 capsule (100 mg total) by mouth 2 (two) times daily.   [DISCONTINUED] predniSONE  (STERAPRED UNI-PAK 21 TAB) 10 MG (21) TBPK tablet Take by mouth daily. Take 6 tabs by mouth daily  for 1 days, then 5 tabs for 1 days, then 4 tabs for 1 days, then 3 tabs for 1 days, 2 tabs for 1 days, then 1 tab by  mouth daily for 1 days   [DISCONTINUED] promethazine -dextromethorphan (PROMETHAZINE -DM) 6.25-15 MG/5ML syrup Take 5 mLs by mouth at bedtime as needed.   No facility-administered encounter medications on file as of 11/13/2023.    Past Medical History:  Diagnosis Date   BV (bacterial vaginosis)    Depression    Liver disease     Past Surgical History:  Procedure Laterality Date   BREAST REDUCTION SURGERY  12/2016   REDUCTION MAMMAPLASTY      Family History  Problem Relation Age of Onset   Healthy Mother    Renal Disease Maternal Grandmother    Uterine cancer Maternal Grandmother    Heart failure Maternal Grandmother    Diabetes Maternal Grandfather    Bladder Cancer Maternal Grandfather    Alcohol abuse Paternal Grandfather     Social History   Socioeconomic History   Marital status: Married    Spouse name: Not on file   Number of children: Not on file   Years of education: Not on file   Highest education level: Not on file  Occupational History   Not on file  Tobacco Use   Smoking status: Former    Current packs/day: 0.00    Types: Cigarettes    Quit date: 2016    Years since quitting: 9.7   Smokeless tobacco: Never  Vaping Use   Vaping status: Former  Substance and Sexual Activity   Alcohol use: Not Currently    Alcohol/week: 18.0 - 20.0 standard drinks of alcohol    Types: 8 - 10 Cans of beer, 10 Shots of liquor per week   Drug use: Not Currently    Types: Marijuana, Cocaine, Benzodiazepines   Sexual activity: Yes    Birth control/protection: None  Other Topics Concern   Not on file  Social History Narrative   Not on file   Social Drivers of Health   Financial Resource Strain: Low Risk  (11/13/2023)   Overall Financial Resource Strain (CARDIA)    Difficulty of Paying Living Expenses: Not hard at all  Food Insecurity: No Food Insecurity (11/13/2023)   Hunger Vital Sign    Worried About Running Out of Food in the Last Year: Never true    Ran Out of  Food in the Last Year: Never true  Transportation Needs: No Transportation Needs (11/13/2023)   PRAPARE - Administrator, Civil Service (Medical): No    Lack of Transportation (Non-Medical): No  Physical Activity: Insufficiently Active (11/13/2023)   Exercise Vital Sign    Days of Exercise per Week: 2 days    Minutes of Exercise per Session: 30 min  Stress: Stress Concern Present (11/13/2023)   [employer] of Occupational Health - Occupational Stress Questionnaire    Feeling of Stress: Rather much  Social Connections: Moderately Isolated (11/13/2023)   Social Connection and Isolation Panel    Frequency of Communication with Friends and Family: Three times a week    Frequency of Social Gatherings with Friends and Family:  Once a week    Attends Religious Services: Never    Active Member of Clubs or Organizations: No    Attends Banker Meetings: Never    Marital Status: Married  Catering manager Violence: Not At Risk (11/13/2023)   Humiliation, Afraid, Rape, and Kick questionnaire    Fear of Current or Ex-Partner: No    Emotionally Abused: No    Physically Abused: No    Sexually Abused: No    ROS      Objective    BP 107/73 (BP Location: Left Arm, Patient Position: Sitting, Cuff Size: Normal)   Pulse 73   Temp 98.2 F (36.8 C) (Oral)   Ht 5' 3 (1.6 m)   Wt 142 lb 3.2 oz (64.5 kg)   SpO2 100%   BMI 25.19 kg/m   Physical Exam Constitutional:      General: She is not in acute distress.    Appearance: Normal appearance. She is not ill-appearing, toxic-appearing or diaphoretic.  HENT:     Head: Normocephalic.     Right Ear: Tympanic membrane normal.     Left Ear: Tympanic membrane normal.     Nose: Nose normal.     Mouth/Throat:     Mouth: Mucous membranes are moist.     Pharynx: Oropharynx is clear.  Eyes:     Extraocular Movements: Extraocular movements intact.     Pupils: Pupils are equal, round, and reactive to light.  Cardiovascular:      Rate and Rhythm: Normal rate and regular rhythm.     Pulses: Normal pulses.     Heart sounds: Normal heart sounds. No murmur heard.    No friction rub. No gallop.  Pulmonary:     Effort: No respiratory distress.     Breath sounds: No stridor. No wheezing, rhonchi or rales.  Chest:     Chest wall: No tenderness.  Musculoskeletal:     Right lower leg: No edema.     Left lower leg: No edema.  Skin:    General: Skin is warm and dry.     Capillary Refill: Capillary refill takes less than 2 seconds.  Neurological:     General: No focal deficit present.     Mental Status: She is alert and oriented to person, place, and time. Mental status is at baseline.     Cranial Nerves: No cranial nerve deficit.     Sensory: No sensory deficit.     Motor: No weakness.     Coordination: Coordination normal.  Psychiatric:        Attention and Perception: Attention and perception normal.        Mood and Affect: Mood normal.        Speech: Speech normal.        Behavior: Behavior normal.        Thought Content: Thought content normal.        Cognition and Memory: Cognition and memory normal.        Judgment: Judgment normal.         Assessment & Plan:  Assessment and Plan Assessment & Plan Alcohol use disorder, mild, controlled Consumes 3-4 IPAs daily. Acknowledges alcoholism history but feels in control. No safety concerns or drinking before work. Agreeable to lowering daily use, but at this time does not feel it is hindering her life.  - Check CMP - Can revisit in future for need for Naltrexone - Take multivitamin with folic acid and thiamine  Major depressive disorder, partial  remission and generalized anxiety disorder Managed with bupropion, sertraline, and propanolol - states significant improvement in anxiety. Previously saw a psychiatrist but currently without due to insurance changes. Satisfied with current medication regimen. - Continue Sertraline 75mg  daily - Continue  Wellbutrin 100mg  daily - Continue Propanolol 60 mg daily - Provide referral to behavioral health for psychiatric care.  Hx of Polysubstance Use Disorder, in remission - Well managed - Referral to psychiatry  Gastroesophageal reflux disease Chronic heartburn managed with esomeprazole. Reports nausea and poor appetite, possibly alcohol-related. Interested in trying Protonix . Discussed long-term PPI risks, including bone health and nutrient absorption. - Prescribe Protonix  for 6-8 weeks. - Provide referral to GI for further evaluation if symptoms persist. - Continue esomeprazole until Protonix  is started. - Decrease ETOH, caffeine, spicy foods, limit food intake after 8pm, wait 2-3 hours after eating to lay down. Water drink of choice.  Dysmenorrhea Severe dysmenorrhea 3-4 times a year, with cramps requiring being out of work, currently manageable. Evaluated with transvaginal ultrasound showing endometrial polyp - present, sees OB/GYN - PAP UTD until 2028.  Personal history of alcoholic liver disease Elevated liver enzymes during heavy drinking in 2007.  Recent hepatic panel in January 2025 was normal. - Order hepatic panel to reassess liver function. - Reduction in ETOH advised.  Personal history of skin cancer Previous skin cancer with removal of cancerous cell. Annual dermatology follow-up for seborrheic keratosis and skin cancer surveillance.  Hidradenitis Suppurativa - managed by Dermatology  Overweight, BMI = 25.19 Previously 165 lbs, lost 20 lbs recently. Previous labs showed elevated LDL cholesterol at 148 mg/dL. - Continue efforts of weight loss and well balanced diet - Limit ETOH consumption - Order fasting lipid panel to reassess cholesterol levels. - Order comprehensive metabolic panel to evaluate current metabolic status.  Encounter for New Doc - declines vaccines today.   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  Dispense: 30 tablet; Refill: 1  PTSD (post-traumatic stress disorder) -     Ambulatory referral to Psychiatry  Polysubstance use disorder -     Ambulatory referral to Psychiatry  Alcohol use disorder, mild, in controlled environment -     Ambulatory referral to Psychiatry  Hidradenitis suppurativa  Dysmenorrhea  History of skin cancer  Depression, major, single episode, in partial remission (HCC)  Encounter to establish care with new doctor    Return in about 6 months (around 05/12/2024) for Chronic Disase Mgmt.   Curtis DELENA Boom, FNP

## 2023-12-05 ENCOUNTER — Encounter (INDEPENDENT_AMBULATORY_CARE_PROVIDER_SITE_OTHER): Payer: Self-pay

## 2023-12-05 ENCOUNTER — Other Ambulatory Visit: Payer: Self-pay | Admitting: Medical Genetics

## 2023-12-05 ENCOUNTER — Ambulatory Visit: Payer: Self-pay | Admitting: Family Medicine

## 2023-12-05 LAB — COMPREHENSIVE METABOLIC PANEL WITH GFR
ALT: 16 IU/L (ref 0–32)
AST: 25 IU/L (ref 0–40)
Albumin: 4.1 g/dL (ref 3.9–4.9)
Alkaline Phosphatase: 67 IU/L (ref 41–116)
BUN/Creatinine Ratio: 12 (ref 9–23)
BUN: 9 mg/dL (ref 6–24)
Bilirubin Total: 1 mg/dL (ref 0.0–1.2)
CO2: 21 mmol/L (ref 20–29)
Calcium: 9.1 mg/dL (ref 8.7–10.2)
Chloride: 101 mmol/L (ref 96–106)
Creatinine, Ser: 0.74 mg/dL (ref 0.57–1.00)
Globulin, Total: 2.5 g/dL (ref 1.5–4.5)
Glucose: 104 mg/dL — ABNORMAL HIGH (ref 70–99)
Potassium: 4.3 mmol/L (ref 3.5–5.2)
Sodium: 135 mmol/L (ref 134–144)
Total Protein: 6.6 g/dL (ref 6.0–8.5)
eGFR: 104 mL/min/1.73 (ref >59)

## 2023-12-05 LAB — CBC
Hematocrit: 37.7 % (ref 34.0–46.6)
Hemoglobin: 11.9 g/dL (ref 11.1–15.9)
MCH: 29.5 pg (ref 26.6–33.0)
MCHC: 31.6 g/dL (ref 31.5–35.7)
MCV: 93 fL (ref 79–97)
Platelets: 236 x10E3/uL (ref 150–450)
RBC: 4.04 x10E6/uL (ref 3.77–5.28)
RDW: 14.3 % (ref 11.7–15.4)
WBC: 5.4 x10E3/uL (ref 3.4–10.8)

## 2023-12-05 LAB — LIPID PANEL
Chol/HDL Ratio: 3.6 ratio (ref 0.0–4.4)
Cholesterol, Total: 245 mg/dL — ABNORMAL HIGH (ref 100–199)
HDL: 69 mg/dL (ref >39)
LDL Chol Calc (NIH): 153 mg/dL — ABNORMAL HIGH (ref 0–99)
Triglycerides: 130 mg/dL (ref 0–149)
VLDL Cholesterol Cal: 23 mg/dL (ref 5–40)

## 2023-12-05 LAB — TSH: TSH: 1.12 u[IU]/mL (ref 0.450–4.500)

## 2023-12-05 LAB — VITAMIN D 25 HYDROXY (VIT D DEFICIENCY, FRACTURES): Vit D, 25-Hydroxy: 56.7 ng/mL (ref 30.0–100.0)

## 2023-12-09 ENCOUNTER — Other Ambulatory Visit
Admission: RE | Admit: 2023-12-09 | Discharge: 2023-12-09 | Disposition: A | Discharge status: Home / Self Care | Payer: Self-pay | Source: Ambulatory Visit | Attending: Medical Genetics | Admitting: Medical Genetics

## 2023-12-16 ENCOUNTER — Other Ambulatory Visit: Payer: Self-pay | Admitting: Family Medicine

## 2023-12-16 DIAGNOSIS — K219 Gastro-esophageal reflux disease without esophagitis: Secondary | ICD-10-CM

## 2023-12-20 LAB — GENECONNECT MOLECULAR SCREEN: Genetic Analysis Overall Interpretation: NEGATIVE

## 2024-02-27 ENCOUNTER — Other Ambulatory Visit: Payer: Self-pay

## 2024-02-27 ENCOUNTER — Encounter: Payer: Self-pay | Admitting: Physician Assistant

## 2024-02-27 DIAGNOSIS — F411 Generalized anxiety disorder: Secondary | ICD-10-CM

## 2024-02-27 MED ORDER — BUPROPION HCL ER (SR) 100 MG PO TB12: 100.0000 mg | ORAL_TABLET | Freq: Every day | ORAL | 0 refills | Status: AC

## 2024-05-12 ENCOUNTER — Ambulatory Visit: Admitting: Physician Assistant
